# Patient Record
Sex: Female | Born: 1990
Health system: Southern US, Community
[De-identification: ages and names within clinical notes are randomized; demographics above are authoritative.]

## PROBLEM LIST (undated history)

## (undated) DIAGNOSIS — L309 Dermatitis, unspecified: Secondary | ICD-10-CM

## (undated) HISTORY — DX: Dermatitis, unspecified: L30.9

---
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
## 2012-09-14 ENCOUNTER — Ambulatory Visit (INDEPENDENT_AMBULATORY_CARE_PROVIDER_SITE_OTHER): Payer: Commercial Managed Care - PPO | Admitting: Family Medicine

## 2012-09-14 VITALS — BP 126/80 | HR 116 | Temp 99.7°F | Resp 16 | Ht 67.0 in | Wt 240.0 lb

## 2012-09-14 DIAGNOSIS — K5289 Other specified noninfective gastroenteritis and colitis: Secondary | ICD-10-CM

## 2012-09-14 DIAGNOSIS — R112 Nausea with vomiting, unspecified: Secondary | ICD-10-CM

## 2012-09-14 DIAGNOSIS — K529 Noninfective gastroenteritis and colitis, unspecified: Secondary | ICD-10-CM

## 2012-09-14 MED ORDER — ONDANSETRON 4 MG PO TBDP: 4.0000 mg | ORAL_TABLET | Freq: Three times a day (TID) | ORAL | Status: DC | PRN

## 2012-09-14 NOTE — Patient Instructions (Signed)
zofran if needed for nausea.  Small sips of fluids frequently. Return to the clinic or go to the nearest emergency room if any of your symptoms worsen or new symptoms occur. recheck in next 2 days if not improved, sooner if any worsening including any increase in back pain.     Gastroenteritis:  Diarrhea Infections caused by germs (bacterial) or a virus commonly cause diarrhea. Your caregiver has determined that with time, rest and fluids, the diarrhea should improve. In general, eat normally while drinking more water than usual. Although water may prevent dehydration, it does not contain salt and minerals (electrolytes). Broths, weak tea without caffeine and oral rehydration solutions (ORS) replace fluids and electrolytes. Small amounts of fluids should be taken frequently. Large amounts at one time may not be tolerated. Plain water may be harmful in infants and the elderly. Oral rehydrating solutions (ORS) are available at pharmacies and grocery stores. ORS replace water and important electrolytes in proper proportions. Sports drinks are not as effective as ORS and may be harmful due to sugars worsening diarrhea.  ORS is especially recommended for use in children with diarrhea. As a general guideline for children, replace any new fluid losses from diarrhea and/or vomiting with ORS as follows:   If your child weighs 22 pounds or under (10 kg or less), give 60-120 mL ( -  cup or 2 - 4 ounces) of ORS for each episode of diarrheal stool or vomiting episode.   If your child weighs more than 22 pounds (more than 10 kgs), give 120-240 mL ( - 1 cup or 4 - 8 ounces) of ORS for each diarrheal stool or episode of vomiting.   While correcting for dehydration, children should eat normally. However, foods high in sugar should be avoided because this may worsen diarrhea. Large amounts of carbonated soft drinks, juice, gelatin desserts and other highly sugared drinks should be avoided.   After correction of  dehydration, other liquids that are appealing to the child may be added. Children should drink small amounts of fluids frequently and fluids should be increased as tolerated. Children should drink enough fluids to keep urine clear or pale yellow.   Adults should eat normally while drinking more fluids than usual. Drink small amounts of fluids frequently and increase as tolerated. Drink enough fluids to keep urine clear or pale yellow. Broths, weak decaffeinated tea, lemon lime soft drinks (allowed to go flat) and ORS replace fluids and electrolytes.   Avoid:   Carbonated drinks.   Juice.   Extremely hot or cold fluids.   Caffeine drinks.   Fatty, greasy foods.   Alcohol.   Tobacco.   Too much intake of anything at one time.   Gelatin desserts.   Probiotics are active cultures of beneficial bacteria. They may lessen the amount and number of diarrheal stools in adults. Probiotics can be found in yogurt with active cultures and in supplements.   Wash hands well to avoid spreading bacteria and virus.   Anti-diarrheal medications are not recommended for infants and children.   Only take over-the-counter or prescription medicines for pain, discomfort or fever as directed by your caregiver. Do not give aspirin to children because it may cause Reye's Syndrome.   For adults, ask your caregiver if you should continue all prescribed and over-the-counter medicines.   If your caregiver has given you a follow-up appointment, it is very important to keep that appointment. Not keeping the appointment could result in a chronic or permanent injury, and  disability. If there is any problem keeping the appointment, you must call back to this facility for assistance.  SEEK IMMEDIATE MEDICAL CARE IF:   You or your child is unable to keep fluids down or other symptoms or problems become worse in spite of treatment.   Vomiting or diarrhea develops and becomes persistent.   There is vomiting of blood  or bile (green material).   There is blood in the stool or the stools are black and tarry.   There is no urine output in 6-8 hours or there is only a small amount of very dark urine.   Abdominal pain develops, increases or localizes.   You have a fever.   Your baby is older than 3 months with a rectal temperature of 102 F (38.9 C) or higher.   Your baby is 56 months old or younger with a rectal temperature of 100.4 F (38 C) or higher.   You or your child develops excessive weakness, dizziness, fainting or extreme thirst.   You or your child develops a rash, stiff neck, severe headache or become irritable or sleepy and difficult to awaken.  MAKE SURE YOU:   Understand these instructions.   Will watch your condition.   Will get help right away if you are not doing well or get worse.  Document Released: 04/02/2002 Document Revised: 04/01/2011 Document Reviewed: 02/17/2009 [facility] Patient Information 2012 [place], [place].  Nausea and Vomiting Nausea is a sick feeling that often comes before throwing up (vomiting). Vomiting is a reflex where stomach contents come out of your mouth. Vomiting can cause severe loss of body fluids (dehydration). Children and elderly adults can become dehydrated quickly, especially if they also have diarrhea. Nausea and vomiting are symptoms of a condition or disease. It is important to find the cause of your symptoms. CAUSES   Direct irritation of the stomach lining. This irritation can result from increased acid production (gastroesophageal reflux disease), infection, food poisoning, taking certain medicines (such as nonsteroidal anti-inflammatory drugs), alcohol use, or tobacco use.   Signals from the brain.These signals could be caused by a headache, heat exposure, an inner ear disturbance, increased pressure in the brain from injury, infection, a tumor, or a concussion, pain, emotional stimulus, or metabolic problems.   An obstruction in the  gastrointestinal tract (bowel obstruction).   Illnesses such as diabetes, hepatitis, gallbladder problems, appendicitis, kidney problems, cancer, sepsis, atypical symptoms of a heart attack, or eating disorders.   Medical treatments such as chemotherapy and radiation.   Receiving medicine that makes you sleep (general anesthetic) during surgery.  DIAGNOSIS Your caregiver may ask for tests to be done if the problems do not improve after a few days. Tests may also be done if symptoms are severe or if the reason for the nausea and vomiting is not clear. Tests may include:  Urine tests.   Blood tests.   Stool tests.   Cultures (to look for evidence of infection).   X-rays or other imaging studies.  Test results can help your caregiver make decisions about treatment or the need for additional tests. TREATMENT You need to stay well hydrated. Drink frequently but in small amounts.You may wish to drink water, sports drinks, clear broth, or eat frozen ice pops or gelatin dessert to help stay hydrated.When you eat, eating slowly may help prevent nausea.There are also some antinausea medicines that may help prevent nausea. HOME CARE INSTRUCTIONS   Take all medicine as directed by your caregiver.   If you  do not have an appetite, do not force yourself to eat. However, you must continue to drink fluids.   If you have an appetite, eat a normal diet unless your caregiver tells you differently.   Eat a variety of complex carbohydrates (rice, wheat, potatoes, bread), lean meats, yogurt, fruits, and vegetables.   Avoid high-fat foods because they are more difficult to digest.   Drink enough water and fluids to keep your urine clear or pale yellow.   If you are dehydrated, ask your caregiver for specific rehydration instructions. Signs of dehydration may include:   Severe thirst.   Dry lips and mouth.   Dizziness.   Dark urine.   Decreasing urine frequency and amount.   Confusion.    Rapid breathing or pulse.  SEEK IMMEDIATE MEDICAL CARE IF:   You have blood or brown flecks (like coffee grounds) in your vomit.   You have black or bloody stools.   You have a severe headache or stiff neck.   You are confused.   You have severe abdominal pain.   You have chest pain or trouble breathing.   You do not urinate at least once every 8 hours.   You develop cold or clammy skin.   You continue to vomit for longer than 24 to 48 hours.   You have a fever.  MAKE SURE YOU:   Understand these instructions.   Will watch your condition.   Will get help right away if you are not doing well or get worse.  Document Released: 04/12/2005 Document Revised: 04/01/2011 Document Reviewed: 09/09/2010 [facility] Patient Information 2012 [place], [place].  Return to the clinic or go to the nearest emergency room if any of your symptoms worsen or new symptoms occur.

## 2012-09-14 NOTE — Progress Notes (Signed)
Subjective:    Patient ID: Chelsey Everett, female    DOB: 1991-04-05, 22 y.o.   MRN: [id number]  HPI Chelsey Everett is a 22 y.o. female  Ate at [employer] last night - chicken.  Nausea this am - vomiting after school. No diarhrea, but no BM yet today.  mutiple family members in office with similar sx's after eating at restaurant last night.  Vomited once - 11am.  None since.  Less nausea now. No abd pain - sore in low back. No hematuria, no dysuria, no urgency/frequency. Back soreness same since starting at 4pm.  No chronic medical problems, no rx meds.  Tx: none.  Able to tolerate yogurt, fluids today.  Review of Systems  Constitutional: Negative for fever and chills.  Gastrointestinal: Positive for nausea and vomiting.  Genitourinary: Negative for dysuria, urgency, frequency and difficulty urinating.  Musculoskeletal: Positive for back pain.  Skin: Negative for rash.       Objective:   Physical Exam  Vitals reviewed. Constitutional: She is oriented to person, place, and time. She appears well-developed and well-nourished. No distress.  HENT:  Head: Normocephalic and atraumatic.  Right Ear: Hearing, tympanic membrane and ear canal normal.  Left Ear: Hearing, tympanic membrane and ear canal normal.  Mouth/Throat: Oropharynx is clear and moist. No oropharyngeal exudate.  Eyes: Conjunctivae and EOM are normal. Pupils are equal, round, and reactive to light.  Cardiovascular: Normal rate, regular rhythm, normal heart sounds and intact distal pulses.   No murmur heard. Pulmonary/Chest: Effort normal and breath sounds normal. No respiratory distress. She has no wheezes. She has no rhonchi.  Abdominal: Soft. Bowel sounds are normal. She exhibits no distension. There is no tenderness. There is no rebound and no guarding.  Neurological: She is alert and oriented to person, place, and time.  Skin: Skin is warm and dry. No rash noted.  Psychiatric: She has a normal mood and  affect. Her behavior is normal.       Assessment & Plan:  Chelsey Everett is a 22 y.o. female N&V (nausea and vomiting) - Plan: ondansetron (ZOFRAN ODT) 4 MG disintegrating tablet  Gastroenteritis - Plan: ondansetron (ZOFRAN ODT) 4 MG disintegrating tablet  Viral GE vs. food borne illness. Symptomatically improving and only single episode of emesis. Sx care - fluids, bland foods, pepto, zofran and imodium only in needed.  rtc precautions.   Meds ordered this encounter  Medications  . ondansetron (ZOFRAN ODT) 4 MG disintegrating tablet    Sig: Take 1 tablet (4 mg total) by mouth every 8 (eight) hours as needed for nausea.    Dispense:  5 tablet    Refill:  0   Patient Instructions  zofran if needed for nausea.  Small sips of fluids frequently. Return to the clinic or go to the nearest emergency room if any of your symptoms worsen or new symptoms occur. recheck in next 2 days if not improved, sooner if any worsening including any increase in back pain.     Gastroenteritis:  Diarrhea Infections caused by germs (bacterial) or a virus commonly cause diarrhea. Your caregiver has determined that with time, rest and fluids, the diarrhea should improve. In general, eat normally while drinking more water than usual. Although water may prevent dehydration, it does not contain salt and minerals (electrolytes). Broths, weak tea without caffeine and oral rehydration solutions (ORS) replace fluids and electrolytes. Small amounts of fluids should be taken frequently. Large amounts at one time may not be  tolerated. Plain water may be harmful in infants and the elderly. Oral rehydrating solutions (ORS) are available at pharmacies and grocery stores. ORS replace water and important electrolytes in proper proportions. Sports drinks are not as effective as ORS and may be harmful due to sugars worsening diarrhea.  ORS is especially recommended for use in children with diarrhea. As a general guideline for  children, replace any new fluid losses from diarrhea and/or vomiting with ORS as follows:   If your child weighs 22 pounds or under (10 kg or less), give 60-120 mL ( -  cup or 2 - 4 ounces) of ORS for each episode of diarrheal stool or vomiting episode.   If your child weighs more than 22 pounds (more than 10 kgs), give 120-240 mL ( - 1 cup or 4 - 8 ounces) of ORS for each diarrheal stool or episode of vomiting.   While correcting for dehydration, children should eat normally. However, foods high in sugar should be avoided because this may worsen diarrhea. Large amounts of carbonated soft drinks, juice, gelatin desserts and other highly sugared drinks should be avoided.   After correction of dehydration, other liquids that are appealing to the child may be added. Children should drink small amounts of fluids frequently and fluids should be increased as tolerated. Children should drink enough fluids to keep urine clear or pale yellow.   Adults should eat normally while drinking more fluids than usual. Drink small amounts of fluids frequently and increase as tolerated. Drink enough fluids to keep urine clear or pale yellow. Broths, weak decaffeinated tea, lemon lime soft drinks (allowed to go flat) and ORS replace fluids and electrolytes.   Avoid:   Carbonated drinks.   Juice.   Extremely hot or cold fluids.   Caffeine drinks.   Fatty, greasy foods.   Alcohol.   Tobacco.   Too much intake of anything at one time.   Gelatin desserts.   Probiotics are active cultures of beneficial bacteria. They may lessen the amount and number of diarrheal stools in adults. Probiotics can be found in yogurt with active cultures and in supplements.   Wash hands well to avoid spreading bacteria and virus.   Anti-diarrheal medications are not recommended for infants and children.   Only take over-the-counter or prescription medicines for pain, discomfort or fever as directed by your caregiver. Do  not give aspirin to children because it may cause Reye's Syndrome.   For adults, ask your caregiver if you should continue all prescribed and over-the-counter medicines.   If your caregiver has given you a follow-up appointment, it is very important to keep that appointment. Not keeping the appointment could result in a chronic or permanent injury, and disability. If there is any problem keeping the appointment, you must call back to this facility for assistance.  SEEK IMMEDIATE MEDICAL CARE IF:   You or your child is unable to keep fluids down or other symptoms or problems become worse in spite of treatment.   Vomiting or diarrhea develops and becomes persistent.   There is vomiting of blood or bile (green material).   There is blood in the stool or the stools are black and tarry.   There is no urine output in 6-8 hours or there is only a small amount of very dark urine.   Abdominal pain develops, increases or localizes.   You have a fever.   Your baby is older than 3 months with a rectal temperature of 102 F (38.9  C) or higher.   Your baby is 54 months old or younger with a rectal temperature of 100.4 F (38 C) or higher.   You or your child develops excessive weakness, dizziness, fainting or extreme thirst.   You or your child develops a rash, stiff neck, severe headache or become irritable or sleepy and difficult to awaken.  MAKE SURE YOU:   Understand these instructions.   Will watch your condition.   Will get help right away if you are not doing well or get worse.  Document Released: 04/02/2002 Document Revised: 04/01/2011 Document Reviewed: 02/17/2009 [facility] Patient Information 2012 [place], [place].  Nausea and Vomiting Nausea is a sick feeling that often comes before throwing up (vomiting). Vomiting is a reflex where stomach contents come out of your mouth. Vomiting can cause severe loss of body fluids (dehydration). Children and elderly adults can become dehydrated  quickly, especially if they also have diarrhea. Nausea and vomiting are symptoms of a condition or disease. It is important to find the cause of your symptoms. CAUSES   Direct irritation of the stomach lining. This irritation can result from increased acid production (gastroesophageal reflux disease), infection, food poisoning, taking certain medicines (such as nonsteroidal anti-inflammatory drugs), alcohol use, or tobacco use.   Signals from the brain.These signals could be caused by a headache, heat exposure, an inner ear disturbance, increased pressure in the brain from injury, infection, a tumor, or a concussion, pain, emotional stimulus, or metabolic problems.   An obstruction in the gastrointestinal tract (bowel obstruction).   Illnesses such as diabetes, hepatitis, gallbladder problems, appendicitis, kidney problems, cancer, sepsis, atypical symptoms of a heart attack, or eating disorders.   Medical treatments such as chemotherapy and radiation.   Receiving medicine that makes you sleep (general anesthetic) during surgery.  DIAGNOSIS Your caregiver may ask for tests to be done if the problems do not improve after a few days. Tests may also be done if symptoms are severe or if the reason for the nausea and vomiting is not clear. Tests may include:  Urine tests.   Blood tests.   Stool tests.   Cultures (to look for evidence of infection).   X-rays or other imaging studies.  Test results can help your caregiver make decisions about treatment or the need for additional tests. TREATMENT You need to stay well hydrated. Drink frequently but in small amounts.You may wish to drink water, sports drinks, clear broth, or eat frozen ice pops or gelatin dessert to help stay hydrated.When you eat, eating slowly may help prevent nausea.There are also some antinausea medicines that may help prevent nausea. HOME CARE INSTRUCTIONS   Take all medicine as directed by your caregiver.   If you  do not have an appetite, do not force yourself to eat. However, you must continue to drink fluids.   If you have an appetite, eat a normal diet unless your caregiver tells you differently.   Eat a variety of complex carbohydrates (rice, wheat, potatoes, bread), lean meats, yogurt, fruits, and vegetables.   Avoid high-fat foods because they are more difficult to digest.   Drink enough water and fluids to keep your urine clear or pale yellow.   If you are dehydrated, ask your caregiver for specific rehydration instructions. Signs of dehydration may include:   Severe thirst.   Dry lips and mouth.   Dizziness.   Dark urine.   Decreasing urine frequency and amount.   Confusion.   Rapid breathing or pulse.  SEEK IMMEDIATE  MEDICAL CARE IF:   You have blood or brown flecks (like coffee grounds) in your vomit.   You have black or bloody stools.   You have a severe headache or stiff neck.   You are confused.   You have severe abdominal pain.   You have chest pain or trouble breathing.   You do not urinate at least once every 8 hours.   You develop cold or clammy skin.   You continue to vomit for longer than 24 to 48 hours.   You have a fever.  MAKE SURE YOU:   Understand these instructions.   Will watch your condition.   Will get help right away if you are not doing well or get worse.  Document Released: 04/12/2005 Document Revised: 04/01/2011 Document Reviewed: 09/09/2010 [facility] Patient Information 2012 [place], [place].  Return to the clinic or go to the nearest emergency room if any of your symptoms worsen or new symptoms occur.

## 2015-06-06 MED FILL — MONTELUKAST SOD 10 MG TAB: 10 | 90 days supply | Qty: 90 | Fill #0

## 2015-06-06 MED FILL — MOMETASONE FUROATE 0.1% OIN: 0.1 | 30 days supply | Qty: 45 | Fill #0

## 2016-06-24 ENCOUNTER — Encounter (INDEPENDENT_AMBULATORY_CARE_PROVIDER_SITE_OTHER): Payer: Self-pay

## 2016-06-24 ENCOUNTER — Ambulatory Visit (INDEPENDENT_AMBULATORY_CARE_PROVIDER_SITE_OTHER): Payer: Commercial Managed Care - PPO | Admitting: Allergy

## 2016-06-24 ENCOUNTER — Encounter: Payer: Self-pay | Admitting: Allergy

## 2016-06-24 VITALS — BP 114/70 | HR 89 | Temp 98.5°F | Resp 18 | Ht 69.75 in | Wt 228.8 lb

## 2016-06-24 DIAGNOSIS — L2089 Other atopic dermatitis: Secondary | ICD-10-CM

## 2016-06-24 DIAGNOSIS — J301 Allergic rhinitis due to pollen: Secondary | ICD-10-CM | POA: Diagnosis not present

## 2016-06-24 MED ORDER — MOMETASONE FUROATE 0.1 % EX OINT: TOPICAL_OINTMENT | CUTANEOUS | 5 refills | Status: AC

## 2016-06-24 MED ORDER — CRISABOROLE 2 % EX OINT: 1.0000 "application " | TOPICAL_OINTMENT | Freq: Two times a day (BID) | CUTANEOUS | 5 refills | Status: DC

## 2016-06-24 NOTE — Patient Instructions (Signed)
Eczema     - we refill your Mometasone 0.1% ointment apply thin layer twice a day to affected areas.  Do not use on face, armpit or genitalia areas.      - try Eucrisa on affected areas.  May use alone or in combination with Mometasone.   May use all over body.       - pat dry after showering/bathing then moisturize with emollient that are more thick/creamy (less water content) to lock in moisture     - use emollient like vaseline around the lips     - use Zyrtec 10mg  daily to help with itch control  Allergies    - Zyrtec as above    - let us know if zyrtec does not control your allergy symptoms enough  Follow-up 1 year or sooner if needed

## 2016-06-24 NOTE — Progress Notes (Signed)
Follow-up Note  RE: Chelsey Everett MRN: [id number] DOB: 01/02/1991 Date of Office Visit: 06/24/2016   History of present illness: Chelsey Everett is a 26 y.o. female presenting today for follow-up of eczema.  She was last seen by Dr. Lucie Everett in January 2016.    She reports her eczema has been acting up for the past several weeks.  Her problem areas are her wrist, arms, and axilla.  She had been using mometasone but ran out about a month ago.  She reports it was very helpful when she had it.  She feels she may be reacting to her Tom's deodorant that she had been using for the past year.  She is back to use Dove deodorant and axilla area is better.  She bathes daily and uses Jergens for moisturization.    She denies any significant nasal or ocular symptoms.  She had testing done in 2015 showing sensitization to grasses, weeds, trees, dust mites.  She used to use Zyrtec but states she has not needed to use this.  She does reports having nosebleeds.       Review of systems: Review of Systems  Constitutional: Negative for chills, fever and malaise/fatigue.  HENT: Positive for nosebleeds. Negative for congestion, ear discharge, ear pain, sinus pain and sore throat.   Eyes: Negative for discharge and redness.  Respiratory: Negative for cough, shortness of breath and wheezing.   Cardiovascular: Negative for chest pain.  Gastrointestinal: Negative for abdominal pain, heartburn, nausea and vomiting.  Musculoskeletal: Negative for joint pain and myalgias.  Skin: Positive for itching and rash.  Neurological: Negative for headaches.    All other systems negative unless noted above in HPI  Past medical/social/surgical/family history have been reviewed and are unchanged unless specifically indicated below.  No changes  Medication List: Allergies as of 06/24/2016   No Known Allergies     Medication List    as of 06/24/2016 11:13 AM   You have not been prescribed any medications.      Known medication allergies: No Known Allergies   Physical examination: Blood pressure 114/70, pulse 89, temperature 98.5 F (36.9 C), temperature source Oral, resp. rate 18, height 5' 9.75" (1.772 m), weight 228 lb 12.8 oz (103.8 kg), SpO2 97 %.  General: Alert, interactive, in no acute distress. HEENT: TMs pearly gray, turbinates minimally edematous without discharge, post-pharynx non erythematous. Neck: Supple without lymphadenopathy. Lungs: Clear to auscultation without wheezing, rhonchi or rales. {no increased work of breathing. CV: Normal S1, S2 without murmurs. Abdomen: Nondistended, nontender. Skin: Dry, mildly hyperpigmented, mildly thickened patches on the bilateral wrists, antecubital fossa and axilla. Extremities:  No clubbing, cyanosis or edema. Neuro:   Grossly intact.  Diagnositics/Labs: none  Assessment and plan:  Atopic dermatitis     - we refill your Mometasone 0.1% ointment apply thin layer twice a day to affected areas.  Do not use on face, armpit or genitalia areas.      - try Eucrisa on affected areas.  May use alone or in combination with Mometasone.   May use all over body.       - pat dry after showering/bathing then moisturize with emollient that are more thick/creamy (less water content) to lock in moisture     - use emollient like vaseline around the lips     - use Zyrtec 10mg  daily to help with itch control  Allergic rhinitis    - Zyrtec as above    - let us know  if zyrtec does not control your allergy symptoms enough  Follow-up 1 year or sooner if needed   I appreciate the opportunity to take part in Chelsey Everett's care. Please do not hesitate to contact me with questions.  Sincerely,   Chelsey Aye, MD Allergy/Immunology [facility]

## 2016-08-09 MED FILL — IBUPROFEN 800 MG TABLET: 800 | 5 days supply | Qty: 20 | Fill #0

## 2016-08-09 MED FILL — AMOXICILLIN 500 MG CAPSULE: 500 | 7 days supply | Qty: 28 | Fill #0

## 2016-08-09 MED FILL — HYDROCODON-APAP 5-325: 5-325 | 3 days supply | Qty: 12 | Fill #0

## 2016-08-25 ENCOUNTER — Encounter (HOSPITAL_COMMUNITY): Payer: Self-pay | Admitting: Emergency Medicine

## 2016-08-25 ENCOUNTER — Ambulatory Visit (HOSPITAL_COMMUNITY)
Admission: EM | Admit: 2016-08-25 | Discharge: 2016-08-25 | Disposition: A | Discharge status: Home / Self Care | Payer: 59 | Attending: Emergency Medicine | Admitting: Emergency Medicine

## 2016-08-25 DIAGNOSIS — J301 Allergic rhinitis due to pollen: Secondary | ICD-10-CM | POA: Diagnosis not present

## 2016-08-25 DIAGNOSIS — J029 Acute pharyngitis, unspecified: Secondary | ICD-10-CM

## 2016-08-25 DIAGNOSIS — R0982 Postnasal drip: Secondary | ICD-10-CM | POA: Diagnosis not present

## 2016-08-25 NOTE — ED Provider Notes (Signed)
CSN: [id number]     Arrival date & time 08/25/16  1910 History   First MD Initiated Contact with Patient 08/25/16 2044     Chief Complaint  Patient presents with  . Sore Throat   (Consider location/radiation/quality/duration/timing/severity/associated sxs/prior Treatment) 26 year old female awoke this morning with a sore throat. She also has some sniffles. She states with her a little worse through the day and sided to come in tonight to get it checked. Denies fever, chills, headache, earache.      Past Medical History:  Diagnosis Date  . Eczema    History reviewed. No pertinent surgical history. Family History  Problem Relation Age of Onset  . Asthma Father   . Allergic rhinitis Neg Hx   . Angioedema Neg Hx   . Eczema Neg Hx   . Immunodeficiency Neg Hx   . Urticaria Neg Hx    Social History  Substance Use Topics  . Smoking status: Never Smoker  . Smokeless tobacco: Never Used  . Alcohol use No   OB History    No data available     Review of Systems  Constitutional: Negative.   HENT: Negative for postnasal drip and rhinorrhea.        As per history of present illness  Respiratory: Negative.   Cardiovascular: Negative.   Musculoskeletal: Negative.   Neurological: Negative.   All other systems reviewed and are negative.   Allergies  Patient has no known allergies.  Home Medications   Prior to Admission medications   Medication Sig Start Date End Date Taking? Authorizing Provider  OVER THE COUNTER MEDICATION Patient was taking antibiotic post tooth extraction.  Finished amoxicillin 1 1/2 weeks ago   Yes Historical Provider, MD  Crisaborole (EUCRISA) 2 % OINT Apply 1 application topically 2 (two) times daily. 06/24/16   Shaylar Larose Hires, MD  mometasone (ELOCON) 0.1 % ointment Apply a thin layer twice a day to affected areas. Do not use on face, armpit, or genitalia. 06/24/16   Shaylar Larose Hires, MD   Meds Ordered and Administered this Visit   Medications - No data to display  BP 123/74 (BP Location: Right Arm) Comment (BP Location): large cuff  Pulse 84   Temp 98.3 F (36.8 C) (Oral)   Resp 18   LMP 08/16/2016   SpO2 100%  No data found.   Physical Exam  Constitutional: She is oriented to person, place, and time. She appears well-developed and well-nourished. No distress.  HENT:  Mouth/Throat: No oropharyngeal exudate.  Oropharynx with moderate erythema and much cobblestoning. Scant clear PND. Patient is observed clearing her throat frequently.  Neck: Normal range of motion. Neck supple.  Cardiovascular: Normal rate, regular rhythm and normal heart sounds.   Pulmonary/Chest: Effort normal and breath sounds normal. No respiratory distress. She has no wheezes.  Musculoskeletal: Normal range of motion. She exhibits no edema.  Lymphadenopathy:    She has no cervical adenopathy.  Neurological: She is alert and oriented to person, place, and time.  Skin: Skin is warm and dry. No rash noted.  Psychiatric: She has a normal mood and affect.  Nursing note and vitals reviewed.   Urgent Care Course     Procedures (including critical care time)  Labs Review Labs Reviewed - No data to display  Imaging Review No results found.   Visual Acuity Review  Right Eye Distance:   Left Eye Distance:   Bilateral Distance:    Right Eye Near:   Left Eye Near:  Bilateral Near:         MDM   1. PND (post-nasal drip)   2. Seasonal allergic rhinitis due to pollen   3. Sore throat    You are having drainage down the back of your throat from your sinuses. This is likely due to your seasonal allergies. This goes along with sniffles. The drainage down the back of your throat causing irritation of the throat. Continue taking the Xyzal. At nighttime if needed he may take Chlor-Trimeton also known as chlorpheniramine 2-4 mg before bedtime to help with drainage. This may cause drowsiness. Also drink a glass of water before  bedtime and upon awakening every day.     Hayden Rasmussen, NP 08/25/16 2056

## 2016-08-25 NOTE — Discharge Instructions (Signed)
You are having drainage down the back of your throat from your sinuses. This is likely due to your seasonal allergies. This goes along with sniffles. The drainage down the back of your throat causing irritation of the throat. Continue taking the Xyzal. At nighttime if needed he may take Chlor-Trimeton also known as chlorpheniramine 2-4 mg before bedtime to help with drainage. This may cause drowsiness. Also drink a glass of water before bedtime and upon awakening every day.

## 2016-08-25 NOTE — ED Triage Notes (Signed)
Patient reports sore throat that started today,  Patient says she has headache, tired, no appetite

## 2016-09-29 MED FILL — IBUPROFEN 800 MG TAB: 800 | 5 days supply | Qty: 20 | Fill #1

## 2017-02-08 DIAGNOSIS — H5213 Myopia, bilateral: Secondary | ICD-10-CM | POA: Diagnosis not present

## 2017-03-13 ENCOUNTER — Other Ambulatory Visit: Payer: Self-pay

## 2017-03-13 ENCOUNTER — Encounter (HOSPITAL_COMMUNITY): Payer: Self-pay

## 2017-03-13 ENCOUNTER — Emergency Department (HOSPITAL_COMMUNITY)
Admission: EM | Admit: 2017-03-13 | Discharge: 2017-03-13 | Disposition: A | Discharge status: Home / Self Care | Payer: 59 | Attending: Emergency Medicine | Admitting: Emergency Medicine

## 2017-03-13 DIAGNOSIS — R42 Dizziness and giddiness: Secondary | ICD-10-CM | POA: Insufficient documentation

## 2017-03-13 DIAGNOSIS — Z79899 Other long term (current) drug therapy: Secondary | ICD-10-CM | POA: Diagnosis not present

## 2017-03-13 DIAGNOSIS — R040 Epistaxis: Secondary | ICD-10-CM | POA: Insufficient documentation

## 2017-03-13 DIAGNOSIS — R9431 Abnormal electrocardiogram [ECG] [EKG]: Secondary | ICD-10-CM | POA: Diagnosis not present

## 2017-03-13 LAB — COMPREHENSIVE METABOLIC PANEL
ALT: 17 U/L (ref 14–54)
AST: 21 U/L (ref 15–41)
Albumin: 3.6 g/dL (ref 3.5–5.0)
Alkaline Phosphatase: 69 U/L (ref 38–126)
Anion gap: 5 (ref 5–15)
BUN: 11 mg/dL (ref 6–20)
CO2: 25 mmol/L (ref 22–32)
Calcium: 8.8 mg/dL — ABNORMAL LOW (ref 8.9–10.3)
Chloride: 107 mmol/L (ref 101–111)
Creatinine, Ser: 0.71 mg/dL (ref 0.44–1.00)
GFR calc Af Amer: >60 mL/min (ref >60)
GFR calc non Af Amer: >60 mL/min (ref >60)
Glucose, Bld: 86 mg/dL (ref 65–99)
Potassium: 3.7 mmol/L (ref 3.5–5.1)
Sodium: 137 mmol/L (ref 135–145)
Total Bilirubin: 0.5 mg/dL (ref 0.3–1.2)
Total Protein: 7.5 g/dL (ref 6.5–8.1)

## 2017-03-13 LAB — URINALYSIS, ROUTINE W REFLEX MICROSCOPIC
Bilirubin Urine: NEGATIVE
Glucose, UA: NEGATIVE mg/dL
Hgb urine dipstick: NEGATIVE
Ketones, ur: NEGATIVE mg/dL
Nitrite: NEGATIVE
Protein, ur: NEGATIVE mg/dL
Specific Gravity, Urine: 1.02 (ref 1.005–1.030)
pH: 6 (ref 5.0–8.0)

## 2017-03-13 LAB — CBC WITH DIFFERENTIAL/PLATELET
Basophils Absolute: 0 10*3/uL (ref 0.0–0.1)
Basophils Relative: 1 %
Eosinophils Absolute: 0.1 10*3/uL (ref 0.0–0.7)
Eosinophils Relative: 2 %
HCT: 33.4 % — ABNORMAL LOW (ref 36.0–46.0)
Hemoglobin: 11.3 g/dL — ABNORMAL LOW (ref 12.0–15.0)
LYMPHS ABS: 2.6 10*3/uL (ref 0.7–4.0)
LYMPHS PCT: 43 %
MCH: 26 pg (ref 26.0–34.0)
MCHC: 33.8 g/dL (ref 30.0–36.0)
MCV: 76.8 fL — ABNORMAL LOW (ref 78.0–100.0)
Monocytes Absolute: 0.6 10*3/uL (ref 0.1–1.0)
Monocytes Relative: 9 %
NEUTROS ABS: 2.8 10*3/uL (ref 1.7–7.7)
NEUTROS PCT: 45 %
Platelets: 316 10*3/uL (ref 150–400)
RBC: 4.35 MIL/uL (ref 3.87–5.11)
RDW: 14.6 % (ref 11.5–15.5)
WBC: 6.1 10*3/uL (ref 4.0–10.5)

## 2017-03-13 LAB — TSH: TSH: 1.21 u[IU]/mL (ref 0.350–4.500)

## 2017-03-13 LAB — POC URINE PREG, ED: Preg Test, Ur: NEGATIVE

## 2017-03-13 LAB — PROTIME-INR
INR: 0.91
Prothrombin Time: 12.2 seconds (ref 11.4–15.2)

## 2017-03-13 MED ORDER — SODIUM CHLORIDE 0.9 % IV BOLUS (SEPSIS)
1000.0000 mL | Freq: Once | INTRAVENOUS | Status: AC
  Administered 2017-03-13: 1000 mL via INTRAVENOUS

## 2017-03-13 MED ORDER — OXYMETAZOLINE HCL 0.05 % NA SOLN: 1.0000 | Freq: Once | NASAL | Status: DC

## 2017-03-13 MED ORDER — ACETAMINOPHEN 325 MG PO TABS
650.0000 mg | ORAL_TABLET | Freq: Once | ORAL | Status: AC
  Administered 2017-03-13: 650 mg via ORAL
  Filled 2017-03-13: qty 2

## 2017-03-13 NOTE — ED Provider Notes (Signed)
[facility]-EMERGENCY DEPT Provider Note   CSN: [id number] Arrival date & time: 03/13/17  1153     History   Chief Complaint Chief Complaint  Patient presents with  . Epistaxis    HPI Chelsey Everett is a 26 y.o. female.  The history is provided by the patient, medical records and a parent. No language interpreter was used.  Epistaxis   This is a recurrent problem. The current episode started more than 2 days ago. The problem occurs every several days. The problem has been resolved. The bleeding has been from the left nare. She has tried applying pressure for the symptoms. The treatment provided significant relief. Her past medical history is significant for allergies and frequent nosebleeds.    Past Medical History:  Diagnosis Date  . Eczema     There are no active problems to display for this patient.   History reviewed. No pertinent surgical history.  OB History    No data available       Home Medications    Prior to Admission medications   Medication Sig Start Date End Date Taking? Authorizing Provider  ibuprofen (ADVIL,MOTRIN) 200 MG tablet Take 800 mg every 6 (six) hours as needed by mouth.   Yes [provider]  mometasone (ELOCON) 0.1 % ointment Apply a thin layer twice a day to affected areas. Do not use on face, armpit, or genitalia. 06/24/16  Yes Marcelyn BruinsPadgett, Shaylar Patricia, MD    Family History Family History  Problem Relation Age of Onset  . Asthma Father   . Allergic rhinitis Neg Hx   . Angioedema Neg Hx   . Eczema Neg Hx   . Immunodeficiency Neg Hx   . Urticaria Neg Hx     Social History Social History   Tobacco Use  . Smoking status: Never Smoker  . Smokeless tobacco: Never Used  Substance Use Topics  . Alcohol use: No  . Drug use: No     Allergies   Patient has no known allergies.   Review of Systems Review of Systems  Constitutional: Negative for chills, diaphoresis and fatigue.  HENT: Positive  for nosebleeds. Negative for congestion, facial swelling, mouth sores, rhinorrhea and sinus pressure.   Eyes: Negative for visual disturbance.  Respiratory: Negative for cough, chest tightness, shortness of breath and wheezing.   Cardiovascular: Negative for chest pain, palpitations and leg swelling.  Gastrointestinal: Negative for diarrhea, nausea and vomiting.  Genitourinary: Negative for dysuria, flank pain and frequency.  Musculoskeletal: Negative for back pain, neck pain and neck stiffness.  Neurological: Negative for light-headedness, numbness and headaches.  Psychiatric/Behavioral: Negative for confusion.  All other systems reviewed and are negative.    Physical Exam Updated Vital Signs BP (!) 119/91 (BP Location: Left Arm)   Pulse 92   Temp 98.2 F (36.8 C) (Oral)   Resp 18   Ht 5\' 4"  (1.626 m)   Wt 96.6 kg (213 lb)   SpO2 100%   BMI 36.56 kg/m   Physical Exam  Constitutional: She is oriented to person, place, and time. She appears well-developed and well-nourished. No distress.  HENT:  Nose: No rhinorrhea, nose lacerations, septal deviation or nasal septal hematoma. Epistaxis (dried blood in L nare. ) is observed.  Mouth/Throat: Oropharynx is clear and moist. No oropharyngeal exudate.  Eyes: Conjunctivae and EOM are normal. Pupils are equal, round, and reactive to light.  Neck: Normal range of motion.  Cardiovascular: Normal rate and intact distal pulses.  No murmur  heard. Pulmonary/Chest: Effort normal and breath sounds normal. She has no wheezes. She has no rales. She exhibits no tenderness.  Abdominal: Soft. There is no tenderness.  Musculoskeletal: She exhibits no tenderness.  Lymphadenopathy:    She has no cervical adenopathy.  Neurological: She is alert and oriented to person, place, and time. No cranial nerve deficit or sensory deficit. She exhibits normal muscle tone.  Skin: Capillary refill takes less than 2 seconds. No rash noted. She is not diaphoretic. No  erythema.  Psychiatric: She has a normal mood and affect.  Nursing note and vitals reviewed.    ED Treatments / Results  Labs (all labs ordered are listed, but only abnormal results are displayed) Labs Reviewed  CBC WITH DIFFERENTIAL/PLATELET - Abnormal; Notable for the following components:      Result Value   Hemoglobin 11.3 (*)    HCT 33.4 (*)    MCV 76.8 (*)    All other components within normal limits  COMPREHENSIVE METABOLIC PANEL - Abnormal; Notable for the following components:   Calcium 8.8 (*)    All other components within normal limits  URINALYSIS, ROUTINE W REFLEX MICROSCOPIC - Abnormal; Notable for the following components:   APPearance HAZY (*)    Leukocytes, UA TRACE (*)    Bacteria, UA FEW (*)    Squamous Epithelial / LPF 0-5 (*)    All other components within normal limits  PROTIME-INR  TSH  POC URINE PREG, ED    EKG  EKG Interpretation  Date/Time:  Sunday March 13 2017 16:34:29 EST Ventricular Rate:  79 PR Interval:    QRS Duration: 100 QT Interval:  364 QTC Calculation: 418 R Axis:   51 Text Interpretation:  Sinus rhythm Baseline wander in lead(s) II III aVF no prior ECG for comparison.  No STEMI Confirmed by Theda Belfast ([id number]) on 03/13/2017 5:17:53 PM       Radiology No results found.  Procedures Procedures (including critical care time)  Medications Ordered in ED Medications  oxymetazoline (AFRIN) 0.05 % nasal spray 1 spray (1 spray Each Nare Refused 03/13/17 1330)  sodium chloride 0.9 % bolus 1,000 mL (1,000 mLs Intravenous New Bag/Given 03/13/17 1715)     Initial Impression / Assessment and Plan / ED Course  I have reviewed the triage vital signs and the nursing notes.  Pertinent labs & imaging results that were available during my care of the patient were reviewed by me and considered in my medical decision making (see chart for details).     Chelsey Everett is a 26 y.o. female with a past medical history significant  for eczema who presents with fatigue and multiple episodes of epistaxis.  Patient says that over the last week she has had 4 nosebleeds.  Reports that each of them lasted approximately 30 minutes.  She says that she had 2 of them today and was feeling lightheaded prompting her to come to the emergency department.  She reports that she has had a history of nose bleeds in the past but thinks the temperature changes is probably the cause of them.  She denies any recent nasal injuries.  She denies any nausea, vomiting, chest pain, shortness of breath.  She denies chest pain or abdominal pain.  She denies any syncopal episodes.  She denies any medication use.  On exam, patient's epistaxis has resolved.  There is no bleeding in her posterior oropharynx.  Dried blood seen in the left nare with no active bleeding.  No evidence of  anterior bleed seen.  Lungs clear.  Chest is nontender.  Abdomen nontender.  No focal neurologic deficits on exam.  Due to the fatigue, EKG was obtained which showed no STEMI or arrhythmias.  Patient given fluids for possible dehydration.  Patient had laboratory testing to look for etiologies of the fatigue or for anemia in the setting of bleeding.  Patient was only mildly anemic.  Other laboratory testing reassuring.  With the fatigue and lightheadedness, urinalysis was performed however it showed no nitrites.  There were leukocytes and bacteria however, after shared decision making conversation, decision was made to not give antibiotics at this time and however have patient follow-up with PCP and follow the urine culture.  Patient and family agreed with the plan of care of discharge home with PCP follow-up.  Patient had no other questions or concerns and was discharged in good condition with resolution of epistaxis and with reassuring   Final Clinical Impressions(s) / ED Diagnoses   Final diagnoses:  Epistaxis  Lightheaded    ED Discharge Orders    None      Clinical  Impression: 1. Epistaxis   2. Lightheaded     Disposition: Discharge  Condition: Good  I have discussed the results, Dx and Tx plan with the pt(& family if present). He/she/they expressed understanding and agree(s) with the plan. Discharge instructions discussed at great length. Strict return precautions discussed and pt &/or family have verbalized understanding of the instructions. No further questions at time of discharge.    This SmartLink is deprecated. Use AVSMEDLIST instead to display the medication list for a patient.  Follow Up: [facility] [address] [place] [phone] Schedule an appointment as soon as possible for a visit    [facility] [id number] [place] [id number] [phone]  If symptoms worsen     Tegeler, Canary Brimhristopher J, MD 03/14/17 [phone]

## 2017-03-13 NOTE — Discharge Instructions (Signed)
Your workup today showed overall reassuring laboratory testing.  We suspect the temperature changes caused her nosebleeds.  Please follow-up with a primary care physician for reassessment and further management.  Please stay hydrated.  If any symptoms change or worsen, please return to the nearest emergency department.

## 2017-03-13 NOTE — ED Triage Notes (Signed)
Pt reports an episode of epistaxis today that lasted about 30 mins. She endorses 2 other episodes this week. Bleeding has subsided in triage. She also reports lethargy and headache x1 week. A&Ox4.

## 2019-07-07 ENCOUNTER — Ambulatory Visit: Payer: 59 | Attending: Internal Medicine

## 2019-07-07 ENCOUNTER — Other Ambulatory Visit: Payer: Self-pay

## 2019-07-07 DIAGNOSIS — Z23 Encounter for immunization: Secondary | ICD-10-CM

## 2019-07-07 NOTE — Progress Notes (Signed)
   Covid-19 Vaccination Clinic  Name:  Chelsey Everett    MRN: [id number] DOB: Mar 12, 1991  07/07/2019  [name] was observed post Covid-19 immunization for 15 minutes without incident. She was provided with Vaccine Information Sheet and instruction to access the V-Safe system.   [name] was instructed to call 911 with any severe reactions post vaccine: Marland Kitchen Difficulty breathing  . Swelling of face and throat  . A fast heartbeat  . A bad rash all over body  . Dizziness and weakness   Immunizations Administered    Name Date Dose VIS Date Route   Pfizer COVID-19 Vaccine 07/07/2019 12:45 PM 0.3 mL 04/06/2019 Intramuscular   Manufacturer: [employer], [employer]   Lot: [id number]   NDC: [address]-6

## 2019-08-01 ENCOUNTER — Ambulatory Visit: Payer: 59 | Attending: Internal Medicine

## 2019-08-01 DIAGNOSIS — Z23 Encounter for immunization: Secondary | ICD-10-CM

## 2019-08-01 NOTE — Progress Notes (Signed)
   Covid-19 Vaccination Clinic  Name:  GARNELL BEGEMAN    MRN: [id number] DOB: [birth year]  08/01/2019  Ms. Speranza was observed post Covid-19 immunization for 15 minutes without incident. She was provided with Vaccine Information Sheet and instruction to access the V-Safe system.   Ms. Peckenpaugh was instructed to call 911 with any severe reactions post vaccine: Marland Kitchen Difficulty breathing  . Swelling of face and throat  . A fast heartbeat  . A bad rash all over body  . Dizziness and weakness   Immunizations Administered    Name Date Dose VIS Date Route   Pfizer COVID-19 Vaccine 08/01/2019  4:07 PM 0.3 mL 04/06/2019 Intramuscular   Manufacturer: [employer], [employer]   Lot: [id number]   NDC: [address]-4

## 2021-04-01 ENCOUNTER — Other Ambulatory Visit: Payer: Self-pay

## 2021-04-01 ENCOUNTER — Emergency Department: Payer: BC Managed Care – PPO

## 2021-04-01 ENCOUNTER — Emergency Department
Admission: EM | Admit: 2021-04-01 | Discharge: 2021-04-01 | Disposition: A | Discharge status: Home / Self Care | Payer: BC Managed Care – PPO | Attending: Student in an Organized Health Care Education/Training Program | Admitting: Student in an Organized Health Care Education/Training Program

## 2021-04-01 ENCOUNTER — Encounter: Payer: Self-pay | Admitting: Emergency Medicine

## 2021-04-01 DIAGNOSIS — H547 Unspecified visual loss: Secondary | ICD-10-CM | POA: Diagnosis present

## 2021-04-01 DIAGNOSIS — R29818 Other symptoms and signs involving the nervous system: Secondary | ICD-10-CM | POA: Diagnosis not present

## 2021-04-01 DIAGNOSIS — H538 Other visual disturbances: Secondary | ICD-10-CM | POA: Insufficient documentation

## 2021-04-01 DIAGNOSIS — H539 Unspecified visual disturbance: Secondary | ICD-10-CM

## 2021-04-01 LAB — CBC
HCT: 36.6 % (ref 36.0–46.0)
Hemoglobin: 12.2 g/dL (ref 12.0–15.0)
MCH: 25.8 pg — ABNORMAL LOW (ref 26.0–34.0)
MCHC: 33.3 g/dL (ref 30.0–36.0)
MCV: 77.4 fL — ABNORMAL LOW (ref 80.0–100.0)
Platelets: 385 10*3/uL (ref 150–400)
RBC: 4.73 MIL/uL (ref 3.87–5.11)
RDW: 14.5 % (ref 11.5–15.5)
WBC: 6.1 10*3/uL (ref 4.0–10.5)
nRBC: 0 % (ref 0.0–0.2)

## 2021-04-01 LAB — DIFFERENTIAL
Abs Immature Granulocytes: 0.01 10*3/uL (ref 0.00–0.07)
Basophils Absolute: 0.1 10*3/uL (ref 0.0–0.1)
Basophils Relative: 1 %
Eosinophils Absolute: 0.1 10*3/uL (ref 0.0–0.5)
Eosinophils Relative: 2 %
Immature Granulocytes: 0 %
Lymphocytes Relative: 40 %
Lymphs Abs: 2.4 10*3/uL (ref 0.7–4.0)
Monocytes Absolute: 0.5 10*3/uL (ref 0.1–1.0)
Monocytes Relative: 7 %
Neutro Abs: 3 10*3/uL (ref 1.7–7.7)
Neutrophils Relative %: 50 %

## 2021-04-01 LAB — COMPREHENSIVE METABOLIC PANEL
ALT: 24 U/L (ref 0–44)
AST: 26 U/L (ref 15–41)
Albumin: 3.9 g/dL (ref 3.5–5.0)
Alkaline Phosphatase: 77 U/L (ref 38–126)
Anion gap: 4 — ABNORMAL LOW (ref 5–15)
BUN: 8 mg/dL (ref 6–20)
CO2: 28 mmol/L (ref 22–32)
Calcium: 9 mg/dL (ref 8.9–10.3)
Chloride: 101 mmol/L (ref 98–111)
Creatinine, Ser: 0.67 mg/dL (ref 0.44–1.00)
GFR, Estimated: >60 mL/min (ref >60)
Glucose, Bld: 91 mg/dL (ref 70–99)
Potassium: 4 mmol/L (ref 3.5–5.1)
Sodium: 133 mmol/L — ABNORMAL LOW (ref 135–145)
Total Bilirubin: 0.6 mg/dL (ref 0.3–1.2)
Total Protein: 7.9 g/dL (ref 6.5–8.1)

## 2021-04-01 LAB — PROTIME-INR
INR: 0.9 (ref 0.8–1.2)
Prothrombin Time: 12.4 seconds (ref 11.4–15.2)

## 2021-04-01 LAB — APTT: aPTT: 29 seconds (ref 24–36)

## 2021-04-01 IMAGING — MR MR HEAD WO/W CM
17 series · 48 of 48 positions shown · IV contrast (gadavist)
Comparison: No pertinent prior exam.

CLINICAL DATA: Neuro deficit, stroke suspected, vision changes

EXAM:
MRI HEAD WITHOUT AND WITH CONTRAST
MRA HEAD WITHOUT CONTRAST
TECHNIQUE: Multiplanar, multi-echo pulse sequences of the brain and surrounding
structures were acquired without and with intravenous contrast.
Angiographic images of the Circle of Willis were acquired using MRA
technique without intravenous contrast.
CONTRAST:  9mL GADAVIST GADOBUTROL 1 MMOL/ML IV SOLN

[Series 5: ax dwi_tracew · axial · 3.0mm · 0.65mm/px · z∈[-102,+46]mm · 2 of 48 slices shown (1 of 2)]
[im 1/48]
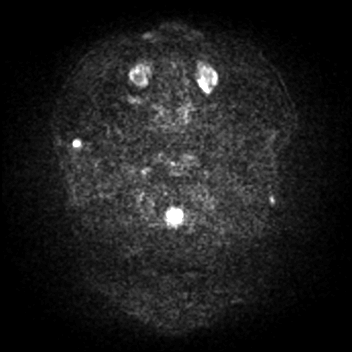
[im 48/48]
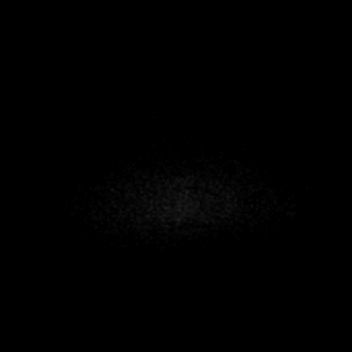

[Series 6: ax dwi_adc · axial · 3.0mm · 0.65mm/px · z∈[-102,+43]mm · 2 of 47 slices shown (1 of 2)]
[im 1/47]
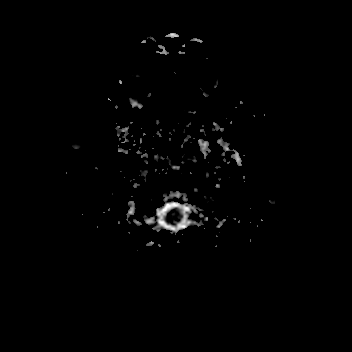
[im 47/47]
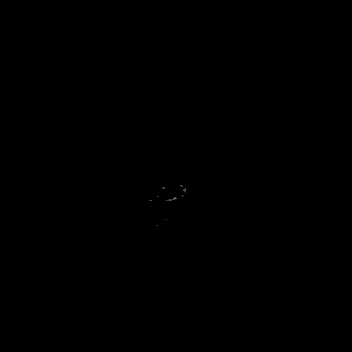

[Series 7: cor dwi_tracew · coronal · 5.0mm · 0.65mm/px · 2 of 40 slices shown (1 of 2)]
[im 1/40]
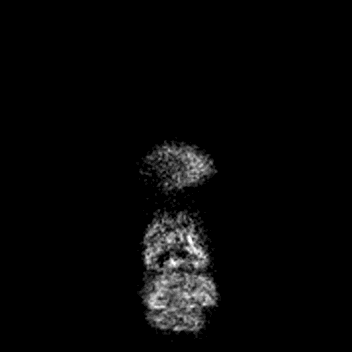
[im 40/40]
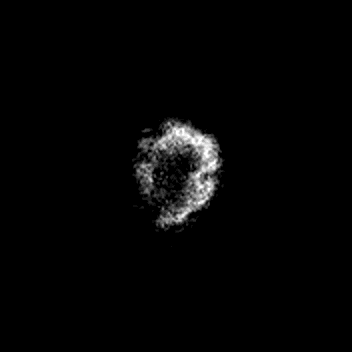

[Series 8: cor dwi_adc · coronal · 5.0mm · 0.65mm/px · 2 of 39 slices shown (1 of 2)]
[im 1/39]
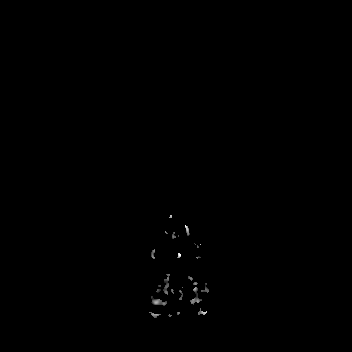
[im 39/39]
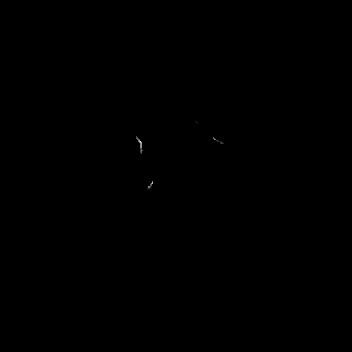

[Series 14: T1 · sagittal · 5.0mm · 0.62mm/px · 1 of 23 slices shown (1 of 2)]
[im 1/23]
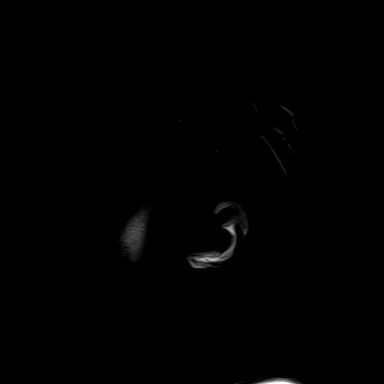

[Series 15: T2 · axial · 5.0mm · 0.53mm/px · 1 of 27 slices shown (1 of 2)]
[im 1/27]
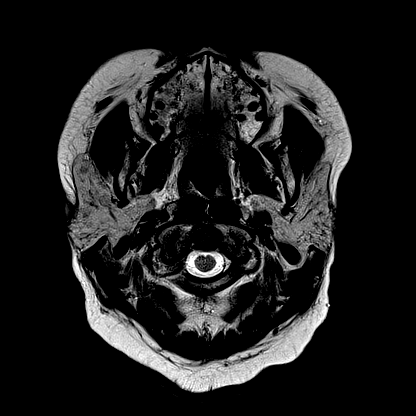

[Series 17: pha_images · axial · 3.0mm · 0.90mm/px · z∈[-102,+40]mm · 3 of 51 slices shown]
[im 1/51]
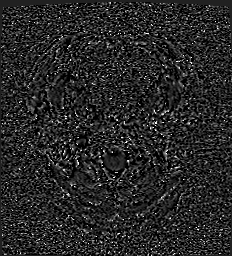
[im 26/51]
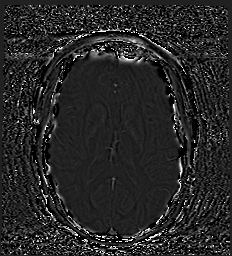
[im 51/51]
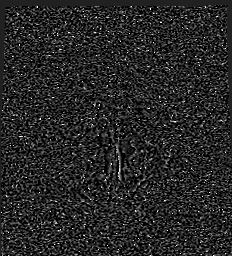

[Series 18: swi_images · axial · 3.0mm · 0.90mm/px · z∈[-105,+40]mm · 3 of 52 slices shown]
[im 1/52]
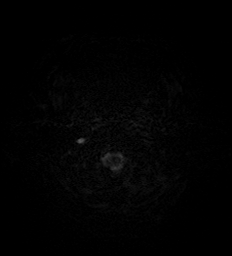
[im 26/52]
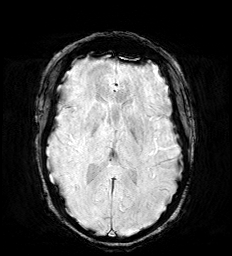
[im 52/52]
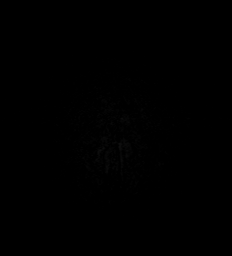

[Series 20: FLAIR · axial · 3.0mm · 0.69mm/px · z∈[-108,+47]mm · 3 of 55 slices shown]
[im 1/55]
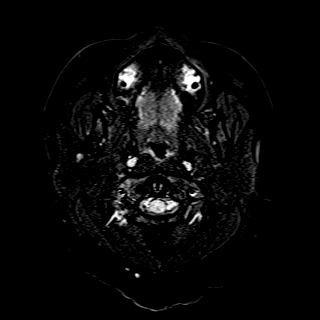
[im 28/55]
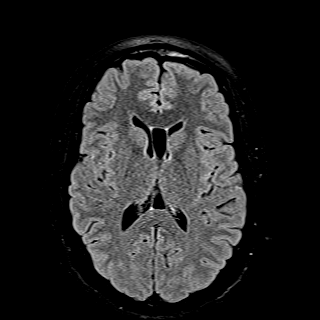
[im 55/55]
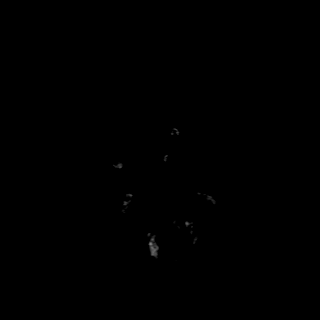

[Series 21: ax dwi_tracew · axial · 3.0mm · 1.31mm/px · z∈[-102,+46]mm · 2 of 47 slices shown (2 of 2)]
[im 1/47]
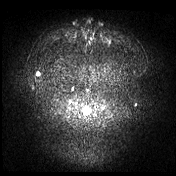
[im 47/47]
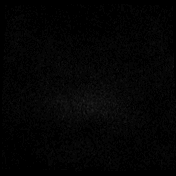

[Series 22: ax dwi_adc · axial · 3.0mm · 1.31mm/px · z∈[-102,+43]mm · 2 of 47 slices shown (2 of 2)]
[im 1/47]
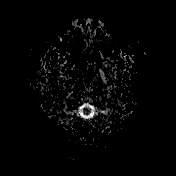
[im 47/47]
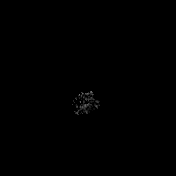

[Series 23: cor dwi_tracew · coronal · 5.0mm · 1.31mm/px · 2 of 40 slices shown (2 of 2)]
[im 1/40]
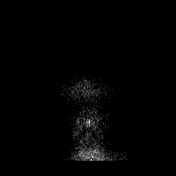
[im 40/40]
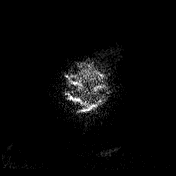

[Series 24: cor dwi_adc · coronal · 5.0mm · 1.31mm/px · 2 of 40 slices shown (2 of 2)]
[im 1/40]
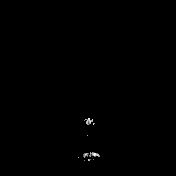
[im 40/40]
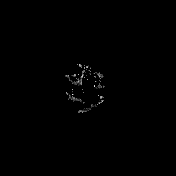

[Series 25: T1 · axial · 1.0mm · 0.98mm/px · z∈[-118,+48]mm · 9 of 176 slices shown (2 of 2)]
[im 1/176]
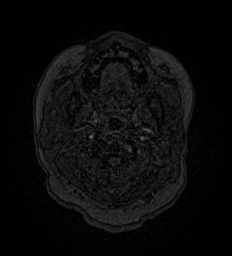
[im 22/176]
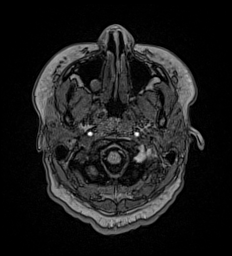
[im 44/176]
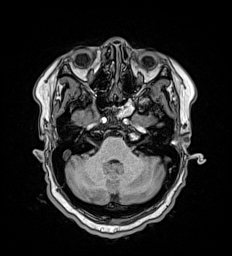
[im 66/176]
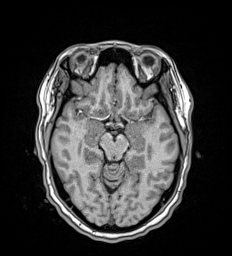
[im 88/176]
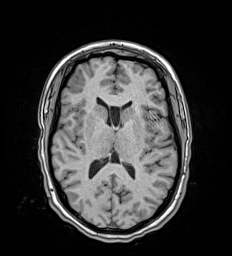
[im 110/176]
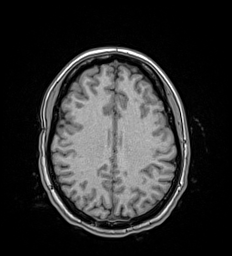
[im 132/176]
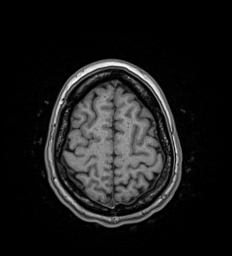
[im 154/176]
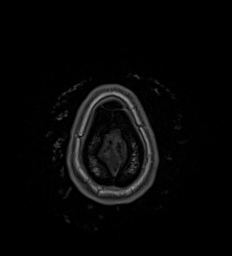
[im 176/176]
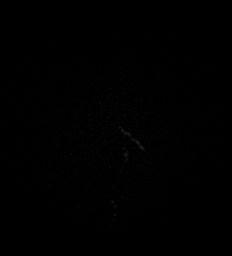

[Series 26: T2 · coronal · 5.0mm · 0.45mm/px · 2 of 33 slices shown (2 of 2)]
[im 1/33]
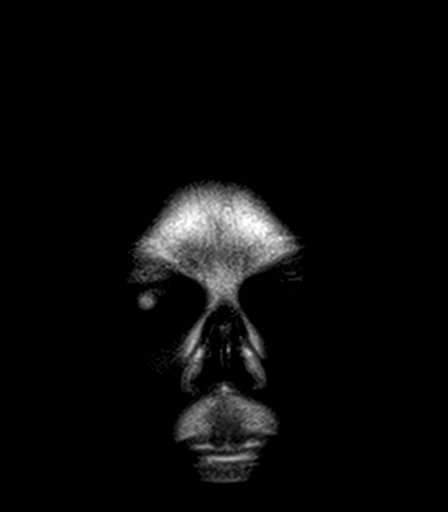
[im 33/33]
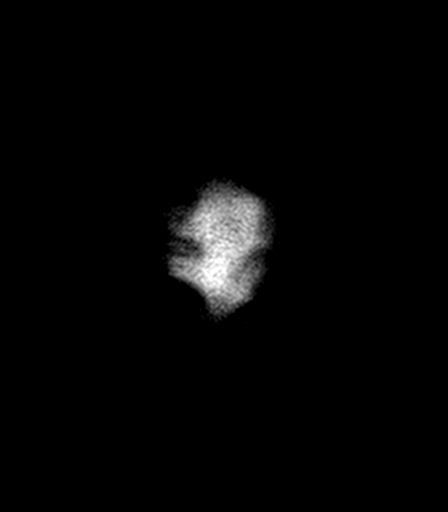

[Series 27: T1 post-contrast · axial · 1.0mm · 0.98mm/px · z∈[-118,+48]mm · 9 of 176 slices shown (1 of 2)]
[im 1/176]
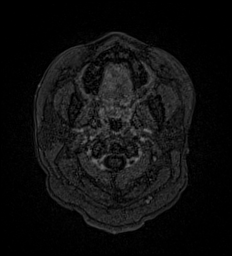
[im 22/176]
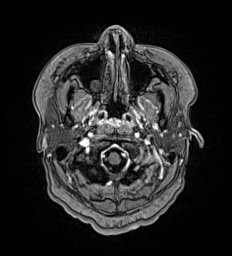
[im 44/176]
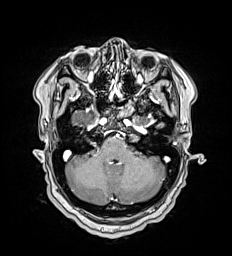
[im 66/176]
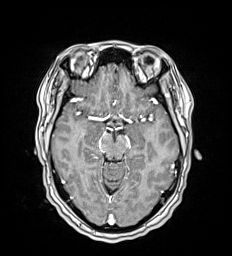
[im 88/176]
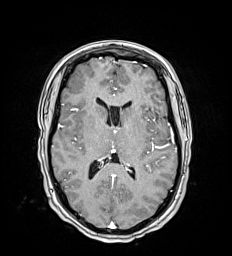
[im 110/176]
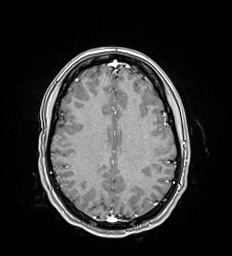
[im 132/176]
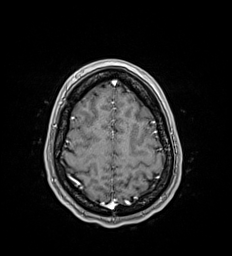
[im 154/176]
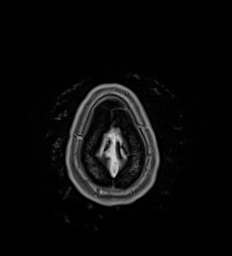
[im 176/176]
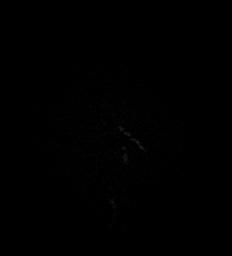

[Series 28: T1 post-contrast · coronal · 5.0mm · 0.57mm/px · 1 of 29 slices shown (2 of 2)]
[im 1/29]
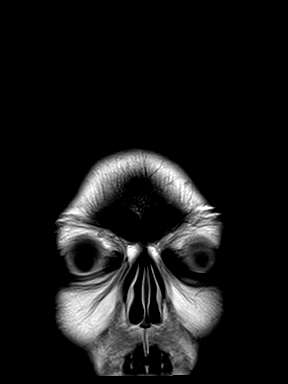

[48 of 48 positions shown; findings below may reference images not displayed]

FINDINGS: MRI HEAD FINDINGS

Brain: No restricted diffusion to suggest acute or subacute infarct.
No acute hemorrhage, mass, mass effect, or midline shift. No
extra-axial collection or hydrocephalus. No dural thickening or
enhancement. No abnormal parenchymal enhancement. No abnormal T2
hyperintense signal.

Vascular: Normal flow voids.

Skull and upper cervical spine: Normal marrow signal.

Sinuses/Orbits: Mucous retention cyst in the right maxillary sinus.
Otherwise negative. The orbits are unremarkable.

Other: The mastoids are well aerated.

MRA HEAD FINDINGS

Anterior circulation: Both internal carotid arteries are patent to
the termini, without stenosis or other abnormality. A1 segments
patent. Normal anterior communicating artery. Anterior cerebral
arteries are patent to their distal aspects. No M1 stenosis or
occlusion. Normal MCA bifurcations. Distal MCA branches perfused and
symmetric.

Posterior circulation: Vertebral arteries patent to the
vertebrobasilar junction without stenosis. Basilar patent to its
distal aspect. Superior cerebellar arteries patent bilaterally. PCAs
perfused to their distal aspects without stenosis. The bilateral
posterior communicating arteries are visualized.

Anatomic variants: None significant
IMPRESSION: 1. No acute intracranial process. No etiology is seen for the
patient's vision changes.
2. Normal MRA of the head.

## 2021-04-01 IMAGING — MR MR MRA HEAD W/O CM
1 series · 19 of 48 positions shown · IV contrast (gadavist)
Comparison: No pertinent prior exam.

CLINICAL DATA: Neuro deficit, stroke suspected, vision changes

EXAM:
MRI HEAD WITHOUT AND WITH CONTRAST
MRA HEAD WITHOUT CONTRAST
TECHNIQUE: Multiplanar, multi-echo pulse sequences of the brain and surrounding
structures were acquired without and with intravenous contrast.
Angiographic images of the Circle of Willis were acquired using MRA
technique without intravenous contrast.
CONTRAST:  9mL GADAVIST GADOBUTROL 1 MMOL/ML IV SOLN

[Series 9: TOF · axial · 0.5mm · 0.41mm/px · z∈[-99,-6]mm · 19 of 205 slices shown]
[im 1/205]
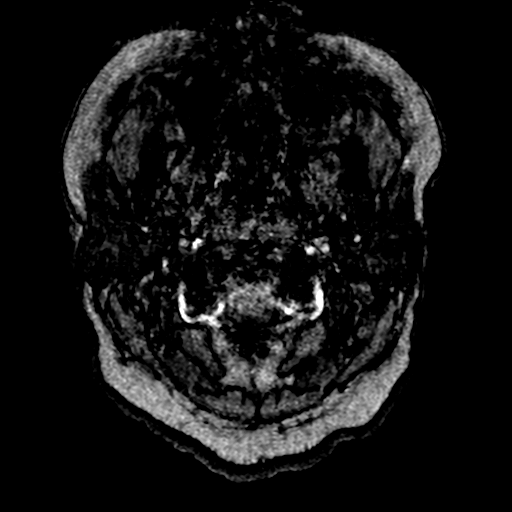
[im 5/205]
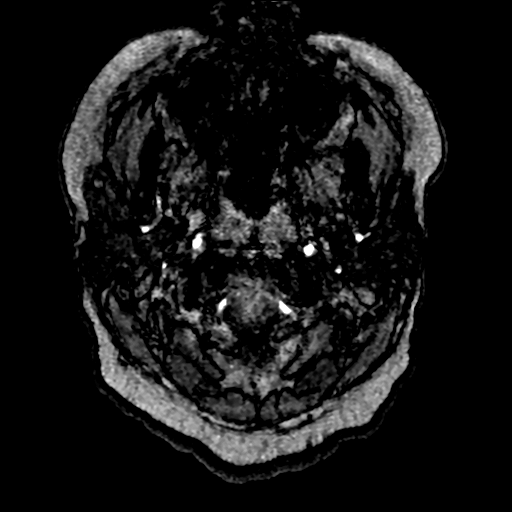
[im 9/205]
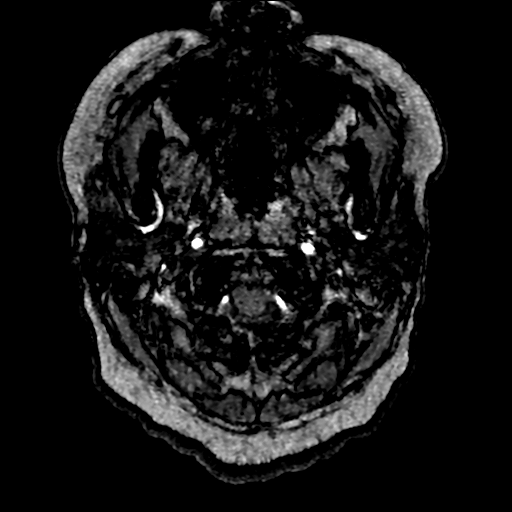
[im 14/205]
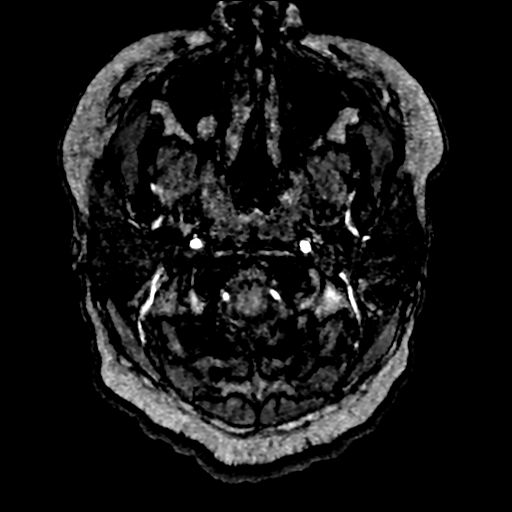
[im 18/205]
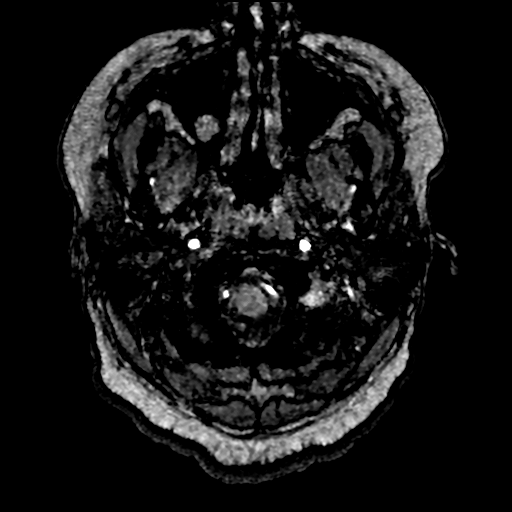
[im 22/205]
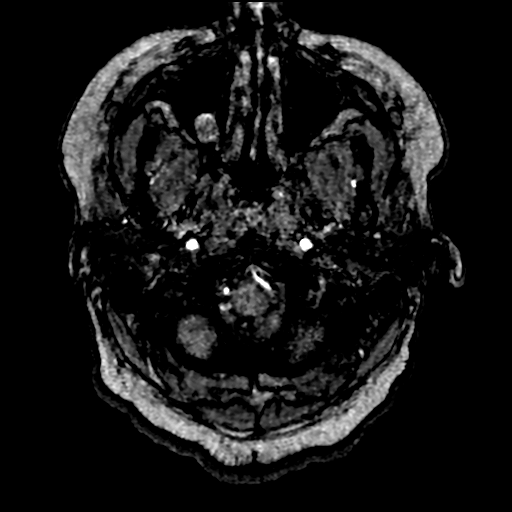
[im 27/205]
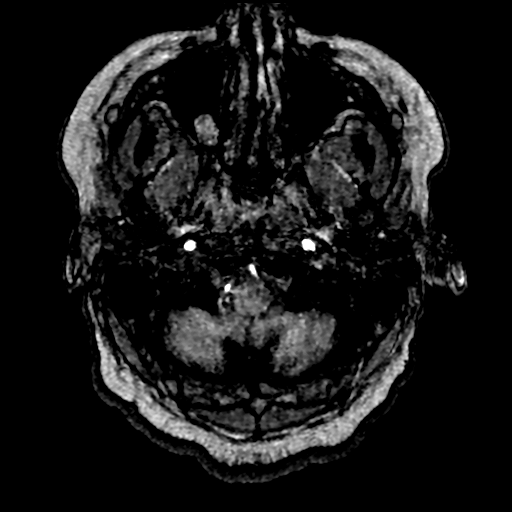
[im 31/205]
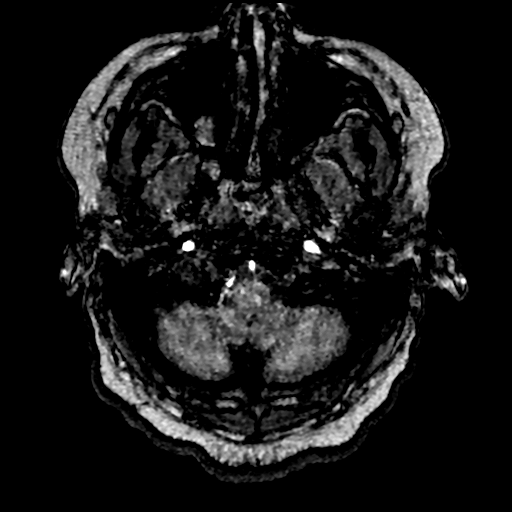
[im 35/205]
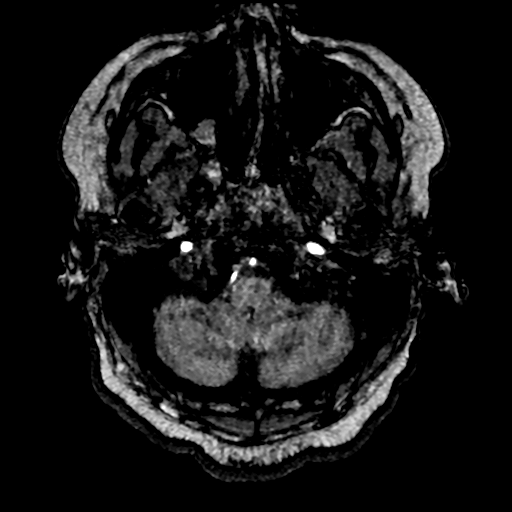
[im 40/205]
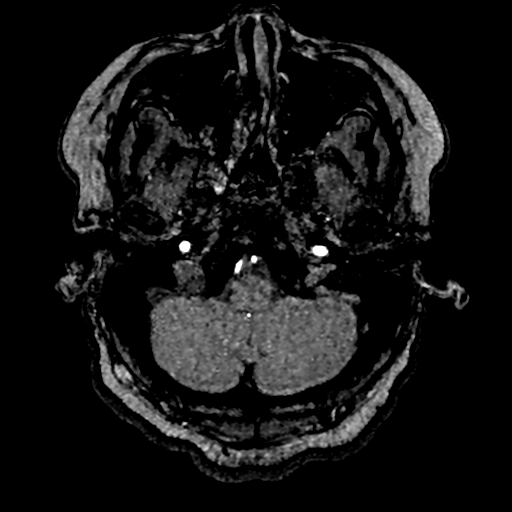
[im 44/205]
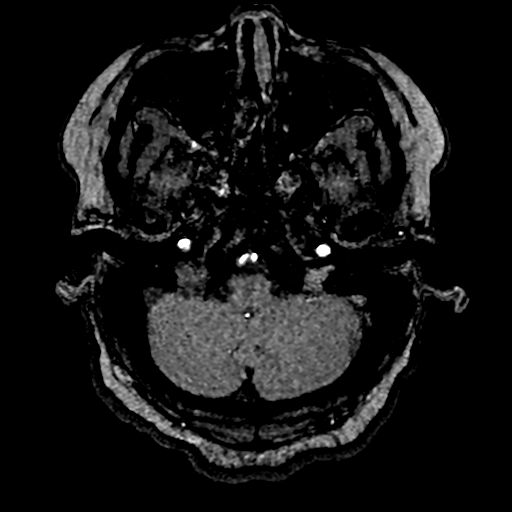
[im 66/205]
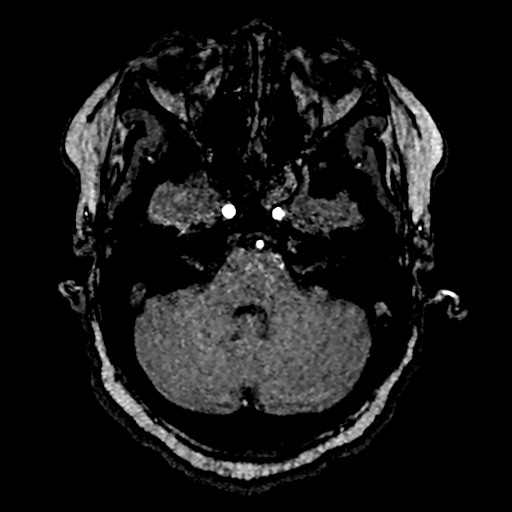
[im 92/205]
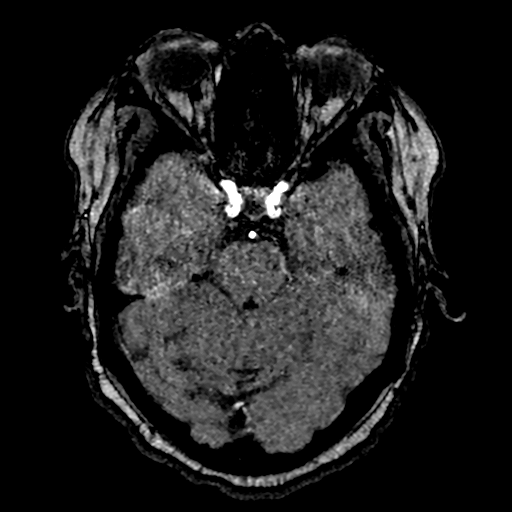
[im 105/205]
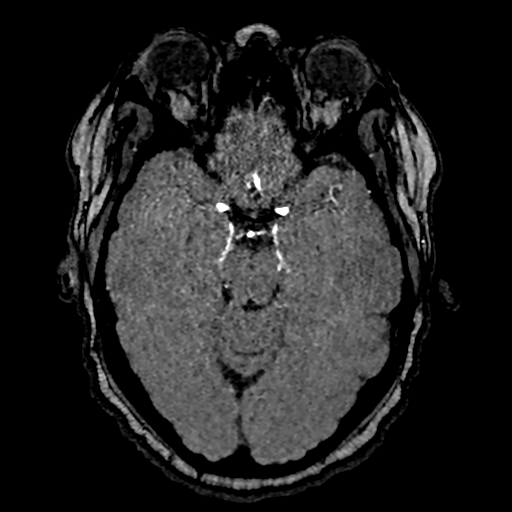
[im 118/205]
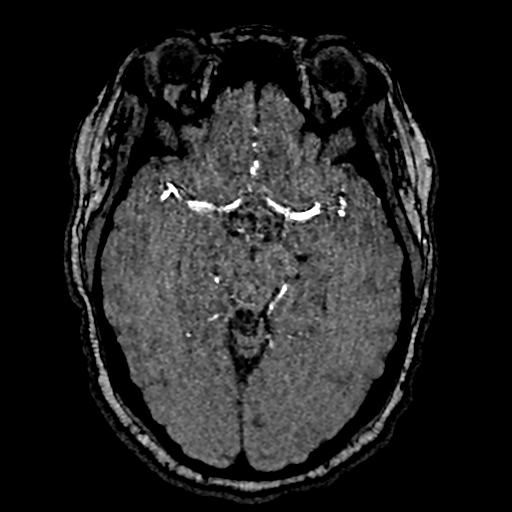
[im 144/205]
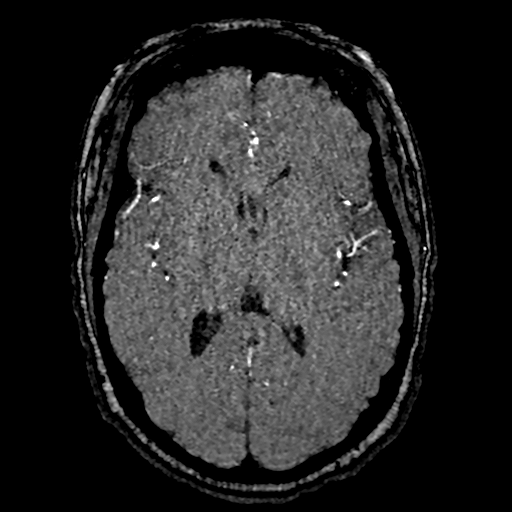
[im 170/205]
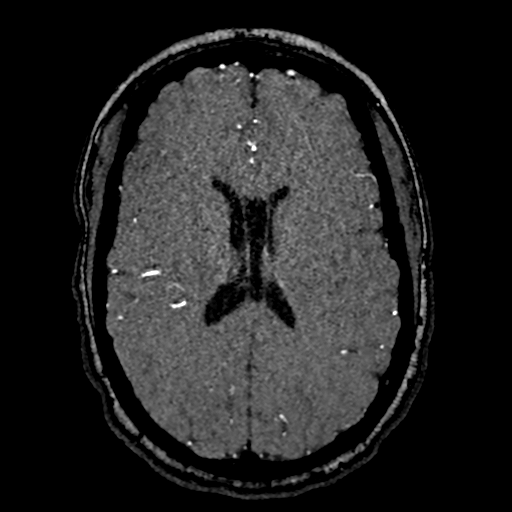
[im 174/205]
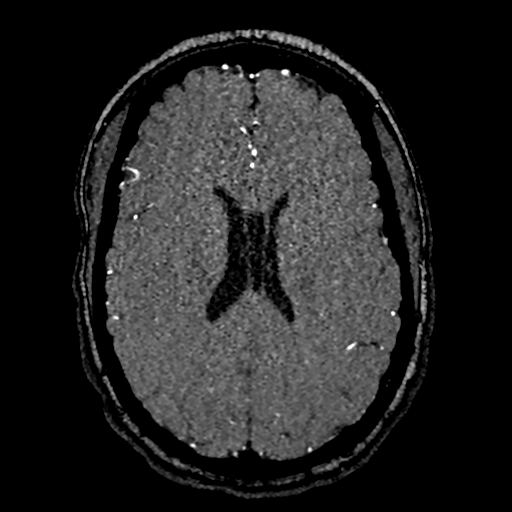
[im 196/205]
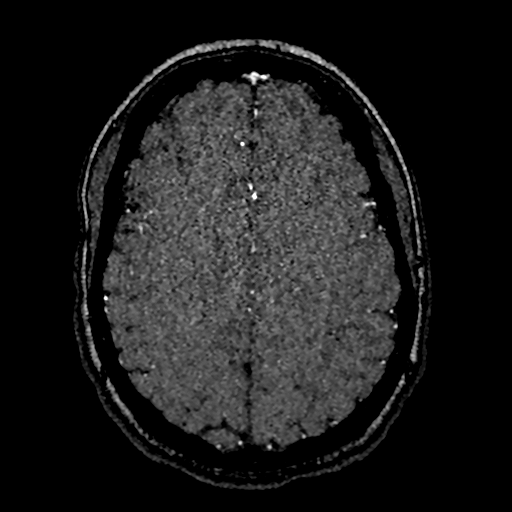

[19 of 48 positions shown; findings below may reference images not displayed]

FINDINGS: MRI HEAD FINDINGS

Brain: No restricted diffusion to suggest acute or subacute infarct.
No acute hemorrhage, mass, mass effect, or midline shift. No
extra-axial collection or hydrocephalus. No dural thickening or
enhancement. No abnormal parenchymal enhancement. No abnormal T2
hyperintense signal.

Vascular: Normal flow voids.

Skull and upper cervical spine: Normal marrow signal.

Sinuses/Orbits: Mucous retention cyst in the right maxillary sinus.
Otherwise negative. The orbits are unremarkable.

Other: The mastoids are well aerated.

MRA HEAD FINDINGS

Anterior circulation: Both internal carotid arteries are patent to
the termini, without stenosis or other abnormality. A1 segments
patent. Normal anterior communicating artery. Anterior cerebral
arteries are patent to their distal aspects. No M1 stenosis or
occlusion. Normal MCA bifurcations. Distal MCA branches perfused and
symmetric.

Posterior circulation: Vertebral arteries patent to the
vertebrobasilar junction without stenosis. Basilar patent to its
distal aspect. Superior cerebellar arteries patent bilaterally. PCAs
perfused to their distal aspects without stenosis. The bilateral
posterior communicating arteries are visualized.

Anatomic variants: None significant
IMPRESSION: 1. No acute intracranial process. No etiology is seen for the
patient's vision changes.
2. Normal MRA of the head.

## 2021-04-01 MED ORDER — GADOBUTROL 1 MMOL/ML IV SOLN
9.0000 mL | Freq: Once | INTRAVENOUS | Status: AC | PRN
  Administered 2021-04-01: 9 mL via INTRAVENOUS

## 2021-04-01 NOTE — ED Provider Notes (Signed)
Emergency Medicine Provider Triage Evaluation Note  Chelsey Everett , a 30 y.o. female  was evaluated in triage.  Pt complains of right eye vision loss that occurred 3 weeks ago.  And states was brief in nature but was complete monocular blindness.  No associated headache.  No numbness or tingling.  Has had some intermittent visual disturbances in the right eye before never on the left.  Was seen at ophthalmology clinic today with reassuring evaluation and was directed to the ER for stroke work-up..  Review of Systems  Positive: Vision loss Negative: tingling  Physical Exam  BP 138/78 (BP Location: Left Arm)   Pulse (!) 103   Temp 98.4 F (36.9 C) (Oral)   Resp 18   Ht 5\' 4"  (1.626 m)   Wt 96.6 kg   LMP 03/20/2021   SpO2 100%   BMI 36.56 kg/m  Gen:   Awake, no distress   Resp:  Normal effort  MSK:   Moves extremities without difficulty  Other:  CN- intact.  No facial droop, Normal FNF.  Normal heel to shin.  Sensation intact bilaterally. Normal speech and language. No gross focal neurologic deficits are appreciated. No gait instability.   Medical Decision Making  Medically screening exam initiated at 2:50 PM.  Appropriate orders placed.  Chelsey Everett was informed that the remainder of the evaluation will be completed by another provider, this initial triage assessment does not replace that evaluation, and the importance of remaining in the ED until their evaluation is complete.  Patient uncertain as to whether she would want to stay for imaging.  Will give referral to neurology.  Have ordered MRI.  Do not believe CT useful at this time given history of presentation.       Michaelle Copas, MD 04/01/21 [phone]

## 2021-04-01 NOTE — ED Triage Notes (Signed)
Pt comes into the ED via POV c/o change in vision a couple weeks ago where her right eye went black and she couldn't see anything.  She went to see the eye MD after that and then f/u with them again today.  They sent the patient here to be evaluated for stroke like symptoms.  Pt states she has not had any more episodes like the one she had 2 weeks ago.  Pt neurologically intact at this time.

## 2021-04-01 NOTE — ED Notes (Signed)
Pt in MRI.

## 2021-05-04 ENCOUNTER — Encounter: Payer: Self-pay | Admitting: Diagnostic Neuroimaging

## 2021-05-04 ENCOUNTER — Ambulatory Visit: Payer: BC Managed Care – PPO | Admitting: Diagnostic Neuroimaging

## 2021-05-04 VITALS — BP 118/86 | HR 86 | Ht 65.0 in | Wt 250.0 lb

## 2021-05-04 DIAGNOSIS — G453 Amaurosis fugax: Secondary | ICD-10-CM

## 2021-05-04 NOTE — Progress Notes (Signed)
[facility]  PATIENT: Chelsey Everett DOB: 04/06/1991  REFERRING CLINICIAN: No ref. provider found HISTORY FROM: patient  REASON FOR VISIT:  new consult    HISTORICAL  CHIEF COMPLAINT:  Chief Complaint  Patient presents with   New Patient (Initial Visit)    Rm 7 alone here for consult on right eye vision loss. Pt reports last month she han an episode where her right eye loss vision for 2-3 minutes. ER and eye doctor work up showed no abnormalities.     HISTORY OF PRESENT ILLNESS:   31 year old female here for evaluation of right eye vision loss.  03/11/2019 patient 1 to 2-minute episode of right eye vision loss right before going to sleep.  No headache, numbness, slurred speech or weakness.  No recurrent symptoms.  She went to ophthalmologist who found no specific ocular issues.  She was referred to ER for evaluation.  She had MRI and MRA of the head which were unremarkable.  Patient does have some history of intermittent headaches 3-4 times per year with mild frontal headache.  No nausea, sensitive to light or sound.  She also has intermittent visual disturbance of the right eye seeing black spots once or twice a year.   REVIEW OF SYSTEMS: Full 14 system review of systems performed and negative with exception of: As per HPI.  ALLERGIES: No Known Allergies  HOME MEDICATIONS: Outpatient Medications Prior to Visit  Medication Sig Dispense Refill   mometasone (ELOCON) 0.1 % ointment Apply a thin layer twice a day to affected areas. Do not use on face, armpit, or genitalia. 45 g 5   ibuprofen (ADVIL,MOTRIN) 200 MG tablet Take 800 mg every 6 (six) hours as needed by mouth. (Patient not taking: Reported on 05/04/2021)     No facility-administered medications prior to visit.    PAST MEDICAL HISTORY: Past Medical History:  Diagnosis Date   Eczema     PAST SURGICAL HISTORY: History reviewed. No pertinent surgical history.  FAMILY HISTORY: Family History   Problem Relation Age of Onset   Asthma Father    Allergic rhinitis Neg Hx    Angioedema Neg Hx    Eczema Neg Hx    Immunodeficiency Neg Hx    Urticaria Neg Hx     SOCIAL HISTORY: Social History   Socioeconomic History   Marital status: Single    Spouse name: Not on file   Number of children: Not on file   Years of education: Not on file   Highest education level: Some college, no degree  Occupational History   Not on file  Tobacco Use   Smoking status: Never   Smokeless tobacco: Never  Substance and Sexual Activity   Alcohol use: No   Drug use: No   Sexual activity: Not on file  Other Topics Concern   Not on file  Social History Narrative   Right handed    Caffeine- 1 cup of coffee per day and then soda through out the day   Social Determinants of Health   Financial Resource Strain: Not on file  Food Insecurity: Not on file  Transportation Needs: Not on file  Physical Activity: Not on file  Stress: Not on file  Social Connections: Not on file  Intimate Partner Violence: Not on file     PHYSICAL EXAM  GENERAL EXAM/CONSTITUTIONAL: Vitals:  Vitals:   05/04/21 1122  BP: 118/86  Pulse: 86  SpO2: 99%  Weight: 250 lb (113.4 kg)  Height: 5\' 5"  (1.651  m)   Body mass index is 41.6 kg/m. Wt Readings from Last 3 Encounters:  05/04/21 250 lb (113.4 kg)  03/13/17 213 lb (96.6 kg)  06/24/16 228 lb 12.8 oz (103.8 kg)   Patient is in no distress; well developed, nourished and groomed; neck is supple  CARDIOVASCULAR: Examination of carotid arteries is normal; no carotid bruits Regular rate and rhythm, no murmurs Examination of peripheral vascular system by observation and palpation is normal  EYES: Ophthalmoscopic exam of optic discs and posterior segments is normal; no papilledema or hemorrhages No results found.  MUSCULOSKELETAL: Gait, strength, tone, movements noted in Neurologic exam below  NEUROLOGIC: MENTAL STATUS:  No flowsheet data  found. awake, alert, oriented to person, place and time recent and remote memory intact normal attention and concentration language fluent, comprehension intact, naming intact fund of knowledge appropriate  CRANIAL NERVE:  2nd - no papilledema on fundoscopic exam 2nd, 3rd, 4th, 6th - pupils equal and reactive to light, visual fields full to confrontation, extraocular muscles intact, no nystagmus 5th - facial sensation symmetric 7th - facial strength symmetric 8th - hearing intact 9th - palate elevates symmetrically, uvula midline 11th - shoulder shrug symmetric 12th - tongue protrusion midline  MOTOR:  normal bulk and tone, full strength in the BUE, BLE  SENSORY:  normal and symmetric to light touch, pinprick, temperature, vibration  COORDINATION:  finger-nose-finger, fine finger movements normal  REFLEXES:  deep tendon reflexes present and symmetric  GAIT/STATION:  narrow based gait; able to walk on toes, heels and tandem; romberg is negative     DIAGNOSTIC DATA (LABS, IMAGING, TESTING) - I reviewed patient records, labs, notes, testing and imaging myself where available.  Lab Results  Component Value Date   WBC 6.1 04/01/2021   HGB 12.2 04/01/2021   HCT 36.6 04/01/2021   MCV 77.4 (L) 04/01/2021   PLT 385 04/01/2021      Component Value Date/Time   NA 133 (L) 04/01/2021 1454   K 4.0 04/01/2021 1454   CL 101 04/01/2021 1454   CO2 28 04/01/2021 1454   GLUCOSE 91 04/01/2021 1454   BUN 8 04/01/2021 1454   CREATININE 0.67 04/01/2021 1454   CALCIUM 9.0 04/01/2021 1454   PROT 7.9 04/01/2021 1454   ALBUMIN 3.9 04/01/2021 1454   AST 26 04/01/2021 1454   ALT 24 04/01/2021 1454   ALKPHOS 77 04/01/2021 1454   BILITOT 0.6 04/01/2021 1454   GFRNONAA >60 04/01/2021 1454   GFRAA >60 03/13/2017 1640   No results found for: CHOL, HDL, LDLCALC, LDLDIRECT, TRIG, CHOLHDL No results found for: OXBD5H No results found for: VITAMINB12 Lab Results  Component Value Date    TSH 1.210 03/13/2017    04/01/2021 MRI MRA of the head [I reviewed images myself and agree with interpretation. -VRP]  1. No acute intracranial process. No etiology is seen for the patient's vision changes. 2. Normal MRA of the head.    ASSESSMENT AND PLAN  31 y.o. year old female here with transient right eye vision loss lasting about 1-2 minutes on 03/10/2021.  Could represent TIA although patient is young and does not have any other obvious risk factors.  Migraine phenomenon is possible.  Idiopathic intracranial hypertension is also possible although patient does not have papilledema at this time.   Dx:  1. Amaurosis fugax of right eye       PLAN:  TRANSIENT RIGHT VISION LOSS (03/10/21; ? TIA vs migraine phenomenon) - check carotid u/s, echocardiogram to complete TIA work-up -  check lipid panel with PCP (planning to get established soon) - due to low likelihood of this being TIA, I am not sure if aspirin or statin indicated at this time  Orders Placed This Encounter  Procedures   ECHOCARDIOGRAM COMPLETE BUBBLE STUDY   VAS US CAROTID   Return for pending if symptoms worsen or fail to improve, pending test results.    Suanne Marker, MD 05/04/2021, 12:10 PM Certified in Neurology, Neurophysiology and Neuroimaging  [facility] [address] [place], [place] [address] [phone]

## 2021-05-04 NOTE — Patient Instructions (Signed)
°  TRANSIENT RIGHT VISION LOSS (03/10/21; ? TIA vs migraine phenomenon) - check carotid u/s, echocardiogram - check lipid panel with PCP

## 2021-06-03 ENCOUNTER — Ambulatory Visit (INDEPENDENT_AMBULATORY_CARE_PROVIDER_SITE_OTHER): Payer: BC Managed Care – PPO

## 2021-06-03 ENCOUNTER — Other Ambulatory Visit: Payer: Self-pay

## 2021-06-03 DIAGNOSIS — G453 Amaurosis fugax: Secondary | ICD-10-CM

## 2021-06-03 LAB — ECHOCARDIOGRAM COMPLETE BUBBLE STUDY
AR max vel: 2.4 cm2
AV Area VTI: 3.01 cm2
AV Area mean vel: 2.79 cm2
AV Mean grad: 4 mmHg
AV Peak grad: 7.6 mmHg
Ao pk vel: 1.38 m/s
Area-P 1/2: 3.63 cm2
S' Lateral: 3.5 cm
Single Plane A4C EF: 58.2 %

## 2021-06-10 ENCOUNTER — Encounter: Payer: Self-pay | Admitting: *Deleted

## 2022-08-12 ENCOUNTER — Ambulatory Visit
Admission: EM | Admit: 2022-08-12 | Discharge: 2022-08-12 | Disposition: A | Discharge status: Home / Self Care | Payer: BC Managed Care – PPO

## 2022-08-12 ENCOUNTER — Encounter: Payer: Self-pay | Admitting: Emergency Medicine

## 2022-08-12 ENCOUNTER — Other Ambulatory Visit: Payer: Self-pay

## 2022-08-12 DIAGNOSIS — B309 Viral conjunctivitis, unspecified: Secondary | ICD-10-CM | POA: Diagnosis not present

## 2022-08-12 NOTE — ED Triage Notes (Signed)
Complains of allergy symptoms for 3-4 days.  Cough, runny nose, sniffles.  Yesterday evening left eye was noticed to be red. This morning, noticed crustiness to eye lashes.   Removed contacts at that time. Has used allergy eye drop last night.  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

## 2022-08-12 NOTE — ED Provider Notes (Signed)
Chelsey Everett    CSN: [id number] Arrival date & time: 08/12/22  4098      History   Chief Complaint No chief complaint on file.   HPI Chelsey Everett is a 32 y.o. female.   HPI  Patient presents to urgent care with complaint of left eye redness and presence of crusty discharge in her eyelashes.  She states she has been using allergy eyedrops.  Concerned about "pinkeye" because several students were sent home with that issue.  She endorses several days of "allergy symptoms".  These include cough, runny nose, sniffles.  Endorses no purulent discharge from the eye.  Discharge is clear or cloudy.  Past Medical History:  Diagnosis Date   Eczema     There are no problems to display for this patient.   No past surgical history on file.  OB History   No obstetric history on file.      Home Medications    Prior to Admission medications   Medication Sig Start Date End Date Taking? Authorizing Provider  ibuprofen (ADVIL,MOTRIN) 200 MG tablet Take 800 mg every 6 (six) hours as needed by mouth. Patient not taking: Reported on 05/04/2021    [provider]  mometasone (ELOCON) 0.1 % ointment Apply a thin layer twice a day to affected areas. Do not use on face, armpit, or genitalia. 06/24/16   Marcelyn Bruins, MD    Family History Family History  Problem Relation Age of Onset   Asthma Father    Allergic rhinitis Neg Hx    Angioedema Neg Hx    Eczema Neg Hx    Immunodeficiency Neg Hx    Urticaria Neg Hx     Social History Social History   Tobacco Use   Smoking status: Never   Smokeless tobacco: Never  Substance Use Topics   Alcohol use: No   Drug use: No     Allergies   Patient has no known allergies.   Review of Systems Review of Systems   Physical Exam Triage Vital Signs ED Triage Vitals  Enc Vitals Group     BP      Pulse      Resp      Temp      Temp src      SpO2      Weight      Height      Head Circumference       Peak Flow      Pain Score      Pain Loc      Pain Edu?      Excl. in GC?    No data found.  Updated Vital Signs There were no vitals taken for this visit.  Visual Acuity Right Eye Distance:   Left Eye Distance:   Bilateral Distance:    Right Eye Near:   Left Eye Near:    Bilateral Near:     Physical Exam Vitals reviewed.  Constitutional:      Appearance: Normal appearance.  Eyes:     Conjunctiva/sclera:     Right eye: Right conjunctiva is not injected. No exudate.    Left eye: Left conjunctiva is not injected. No exudate. Skin:    General: Skin is warm and dry.  Neurological:     General: No focal deficit present.     Mental Status: She is alert and oriented to person, place, and time.  Psychiatric:        Mood and Affect: Mood  normal.        Behavior: Behavior normal.      UC Treatments / Results  Labs (all labs ordered are listed, but only abnormal results are displayed) Labs Reviewed - No data to display  EKG   Radiology No results found.  Procedures Procedures (including critical care time)  Medications Ordered in UC Medications - No data to display  Initial Impression / Assessment and Plan / UC Course  I have reviewed the triage vital signs and the nursing notes.  Pertinent labs & imaging results that were available during my care of the patient were reviewed by me and considered in my medical decision making (see chart for details).   Chelsey Everett is a 32 y.o. female presenting with L eye discharge. Patient is afebrile without recent antipyretics, satting well on room air. Overall is well appearing though non-toxic, well hydrated, without respiratory distress.  No conjunctival injection is noted.  No purulent exudate.  Suspect viral conjunctivitis and recommending supportive care with use of OTC medication for symptom relief.  Counseled patient on potential for adverse effects with medications prescribed/recommended today, ER and  return-to-clinic precautions discussed, patient verbalized understanding and agreement with care plan.   Final Clinical Impressions(s) / UC Diagnoses   Final diagnoses:  None   Discharge Instructions   None    ED Prescriptions   None    PDMP not reviewed this encounter.   Charma Igo, FNP 08/12/22 1022

## 2022-08-12 NOTE — Discharge Instructions (Addendum)
Avoid touching your eyes. You may try TheraTears or Allergy (olopatadine) eye drops for relief of itching.  Follow up here or with your primary care provider if your symptoms are worsening or not improving.

## 2023-10-06 ENCOUNTER — Telehealth: Admitting: Nurse Practitioner

## 2023-10-06 DIAGNOSIS — J029 Acute pharyngitis, unspecified: Secondary | ICD-10-CM | POA: Diagnosis not present

## 2023-10-06 MED ORDER — IBUPROFEN 600 MG PO TABS: 600.0000 mg | ORAL_TABLET | Freq: Four times a day (QID) | ORAL | 0 refills | Status: AC | PRN

## 2023-10-06 NOTE — Progress Notes (Signed)
 E-Visit for Sore Throat  We are sorry that you are not feeling well.  Here is how we plan to help!  Providers prescribe antibiotics to treat infections caused by bacteria. Antibiotics are very powerful in treating bacterial infections when they are used properly. To maintain their effectiveness, they should be used only when necessary. Overuse of antibiotics has resulted in the development of superbugs that are resistant to treatment!    After careful review of your answers, I would not recommend an antibiotic for your condition.  Antibiotics are not effective against viruses and therefore should not be used to treat them. Common examples of infections caused by viruses include colds and flu   Your symptoms indicate a likely viral infection (Pharyngitis).   Pharyngitis is inflammation in the back of the throat which can cause a sore throat, scratchiness and sometimes difficulty swallowing.   Pharyngitis is typically caused by a respiratory virus and will just run its course.  Please keep in mind that your symptoms could last up to 10 days.  For throat pain, we recommend over the counter oral pain relief medications such as acetaminophen  or aspirin, or anti-inflammatory medications such as ibuprofen or naproxen sodium.  Topical treatments such as oral throat lozenges or sprays may be used as needed.  Avoid close contact with loved ones, especially the very young and elderly.  Remember to wash your hands thoroughly throughout the day as this is the number one way to prevent the spread of infection and wipe down door knobs and counters with disinfectant.  Providers prescribe antibiotics to treat infections caused by bacteria. Antibiotics are very powerful in treating bacterial infections when they are used properly.  To maintain their effectiveness, they should be used only when necessary.  Overuse of antibiotics has resulted in the development of super bugs that are resistant to treatment!    Home  Care: Only take medications as instructed by your medical team. Do not drink alcohol while taking these medications. A steam or ultrasonic humidifier can help congestion.  You can place a towel over your head and breathe in the steam from hot water coming from a faucet. Avoid close contacts especially the very young and the elderly. Cover your mouth when you cough or sneeze. Always remember to wash your hands.  Get Help Right Away If: You develop worsening fever or throat pain. You develop a severe head ache or visual changes. Your symptoms persist after you have completed your treatment plan.  Make sure you Understand these instructions. Will watch your condition. Will get help right away if you are not doing well or get worse.   Thank you for choosing an e-visit.  Your e-visit answers were reviewed by a board certified advanced clinical practitioner to complete your personal care plan. Depending upon the condition, your plan could have included both over the counter or prescription medications.  Please review your pharmacy choice. Make sure the pharmacy is open so you can pick up prescription now. If there is a problem, you may contact your provider through [employer] and have the prescription routed to another pharmacy.  Your safety is important to us . If you have drug allergies check your prescription carefully.   For the next 24 hours you can use MyChart to ask questions about today's visit, request a non-urgent call back, or ask for a work or school excuse. You will get an email in the next two days asking about your experience. I hope that your e-visit has been  valuable and will speed your recovery.

## 2023-10-06 NOTE — Progress Notes (Signed)
 I have spent 5 minutes in review of e-visit questionnaire, review and updating patient chart, medical decision making and response to patient.   Claiborne Rigg, NP
